# Patient Record
Sex: Female | Born: 1960 | Race: White | Hispanic: No | Marital: Married | Smoking: Former smoker
Health system: Southern US, Community
[De-identification: ages and names within clinical notes are randomized; demographics above are authoritative.]

## PROBLEM LIST (undated history)

## (undated) DIAGNOSIS — K254 Chronic or unspecified gastric ulcer with hemorrhage: Secondary | ICD-10-CM

## (undated) DIAGNOSIS — Z8489 Family history of other specified conditions: Secondary | ICD-10-CM

## (undated) DIAGNOSIS — Z87442 Personal history of urinary calculi: Secondary | ICD-10-CM

## (undated) DIAGNOSIS — M1990 Unspecified osteoarthritis, unspecified site: Secondary | ICD-10-CM

## (undated) DIAGNOSIS — I10 Essential (primary) hypertension: Secondary | ICD-10-CM

## (undated) DIAGNOSIS — Z9889 Other specified postprocedural states: Secondary | ICD-10-CM

## (undated) DIAGNOSIS — R112 Nausea with vomiting, unspecified: Secondary | ICD-10-CM

## (undated) HISTORY — PX: BACK SURGERY: SHX140

## (undated) HISTORY — PX: CHOLECYSTECTOMY: SHX55

## (undated) HISTORY — PX: ABDOMINAL HYSTERECTOMY: SHX81

## (undated) HISTORY — PX: APPENDECTOMY: SHX54

---

## 2001-11-21 ENCOUNTER — Encounter: Payer: Self-pay | Admitting: Family Medicine

## 2001-11-21 ENCOUNTER — Ambulatory Visit (HOSPITAL_COMMUNITY): Admission: RE | Admit: 2001-11-21 | Discharge: 2001-11-21 | Payer: Self-pay | Admitting: Family Medicine

## 2002-06-23 ENCOUNTER — Encounter: Payer: Self-pay | Admitting: Internal Medicine

## 2002-06-23 ENCOUNTER — Ambulatory Visit (HOSPITAL_COMMUNITY): Admission: RE | Admit: 2002-06-23 | Discharge: 2002-06-23 | Payer: Self-pay | Admitting: Internal Medicine

## 2003-01-10 ENCOUNTER — Inpatient Hospital Stay (HOSPITAL_COMMUNITY): Admission: RE | Admit: 2003-01-10 | Discharge: 2003-01-13 | Payer: Self-pay | Admitting: Internal Medicine

## 2003-02-16 ENCOUNTER — Encounter (INDEPENDENT_AMBULATORY_CARE_PROVIDER_SITE_OTHER): Payer: Self-pay | Admitting: Internal Medicine

## 2003-02-16 ENCOUNTER — Ambulatory Visit (HOSPITAL_COMMUNITY): Admission: RE | Admit: 2003-02-16 | Discharge: 2003-02-16 | Payer: Self-pay | Admitting: Internal Medicine

## 2003-03-09 ENCOUNTER — Ambulatory Visit (HOSPITAL_COMMUNITY): Admission: RE | Admit: 2003-03-09 | Discharge: 2003-03-09 | Payer: Self-pay | Admitting: Urology

## 2003-03-09 ENCOUNTER — Encounter: Payer: Self-pay | Admitting: Urology

## 2003-04-04 ENCOUNTER — Ambulatory Visit (HOSPITAL_COMMUNITY): Admission: RE | Admit: 2003-04-04 | Discharge: 2003-04-04 | Payer: Self-pay | Admitting: Internal Medicine

## 2003-07-28 ENCOUNTER — Ambulatory Visit (HOSPITAL_COMMUNITY): Admission: RE | Admit: 2003-07-28 | Discharge: 2003-07-28 | Payer: Self-pay | Admitting: Internal Medicine

## 2003-08-02 ENCOUNTER — Ambulatory Visit (HOSPITAL_COMMUNITY): Admission: RE | Admit: 2003-08-02 | Discharge: 2003-08-02 | Payer: Self-pay | Admitting: Internal Medicine

## 2004-01-01 ENCOUNTER — Emergency Department (HOSPITAL_COMMUNITY): Admission: EM | Admit: 2004-01-01 | Discharge: 2004-01-02 | Payer: Self-pay | Admitting: *Deleted

## 2004-01-03 ENCOUNTER — Ambulatory Visit (HOSPITAL_COMMUNITY): Admission: RE | Admit: 2004-01-03 | Discharge: 2004-01-03 | Payer: Self-pay | Admitting: Urology

## 2004-01-04 ENCOUNTER — Ambulatory Visit (HOSPITAL_COMMUNITY): Admission: RE | Admit: 2004-01-04 | Discharge: 2004-01-04 | Payer: Self-pay | Admitting: Urology

## 2004-01-06 ENCOUNTER — Ambulatory Visit (HOSPITAL_COMMUNITY): Admission: RE | Admit: 2004-01-06 | Discharge: 2004-01-06 | Payer: Self-pay | Admitting: Urology

## 2004-03-29 ENCOUNTER — Ambulatory Visit (HOSPITAL_COMMUNITY): Admission: RE | Admit: 2004-03-29 | Discharge: 2004-03-29 | Payer: Self-pay | Admitting: Urology

## 2004-04-13 ENCOUNTER — Ambulatory Visit (HOSPITAL_COMMUNITY): Admission: RE | Admit: 2004-04-13 | Discharge: 2004-04-13 | Payer: Self-pay | Admitting: Urology

## 2004-09-11 ENCOUNTER — Ambulatory Visit (HOSPITAL_COMMUNITY): Admission: RE | Admit: 2004-09-11 | Discharge: 2004-09-11 | Payer: Self-pay | Admitting: Urology

## 2004-12-24 ENCOUNTER — Ambulatory Visit (HOSPITAL_COMMUNITY): Admission: RE | Admit: 2004-12-24 | Discharge: 2004-12-24 | Payer: Self-pay | Admitting: Family Medicine

## 2005-02-08 ENCOUNTER — Ambulatory Visit (HOSPITAL_COMMUNITY): Admission: RE | Admit: 2005-02-08 | Discharge: 2005-02-08 | Payer: Self-pay | Admitting: Urology

## 2005-03-27 ENCOUNTER — Ambulatory Visit (HOSPITAL_COMMUNITY): Admission: RE | Admit: 2005-03-27 | Discharge: 2005-03-27 | Payer: Self-pay | Admitting: Urology

## 2005-03-28 ENCOUNTER — Ambulatory Visit (HOSPITAL_COMMUNITY): Admission: RE | Admit: 2005-03-28 | Discharge: 2005-03-28 | Payer: Self-pay | Admitting: Internal Medicine

## 2005-08-30 ENCOUNTER — Ambulatory Visit (HOSPITAL_COMMUNITY): Admission: RE | Admit: 2005-08-30 | Discharge: 2005-08-30 | Payer: Self-pay | Admitting: Family Medicine

## 2010-10-13 ENCOUNTER — Encounter: Payer: Self-pay | Admitting: General Surgery

## 2011-04-16 ENCOUNTER — Other Ambulatory Visit (HOSPITAL_COMMUNITY): Payer: Self-pay | Admitting: Internal Medicine

## 2011-04-16 DIAGNOSIS — Z139 Encounter for screening, unspecified: Secondary | ICD-10-CM

## 2011-04-19 ENCOUNTER — Ambulatory Visit (HOSPITAL_COMMUNITY)
Admission: RE | Admit: 2011-04-19 | Discharge: 2011-04-19 | Disposition: A | Discharge status: Home / Self Care | Payer: BC Managed Care – PPO | Source: Ambulatory Visit | Attending: Internal Medicine | Admitting: Internal Medicine

## 2011-04-19 DIAGNOSIS — Z1231 Encounter for screening mammogram for malignant neoplasm of breast: Secondary | ICD-10-CM | POA: Insufficient documentation

## 2011-04-19 DIAGNOSIS — Z139 Encounter for screening, unspecified: Secondary | ICD-10-CM

## 2012-06-15 ENCOUNTER — Other Ambulatory Visit (HOSPITAL_COMMUNITY): Payer: Self-pay | Admitting: Internal Medicine

## 2012-06-15 DIAGNOSIS — Z0000 Encounter for general adult medical examination without abnormal findings: Secondary | ICD-10-CM

## 2012-06-22 ENCOUNTER — Inpatient Hospital Stay (HOSPITAL_COMMUNITY): Admission: RE | Admit: 2012-06-22 | Payer: BC Managed Care – PPO | Source: Ambulatory Visit

## 2015-05-17 ENCOUNTER — Other Ambulatory Visit (HOSPITAL_COMMUNITY): Payer: Self-pay | Admitting: Physician Assistant

## 2015-05-17 DIAGNOSIS — Z139 Encounter for screening, unspecified: Secondary | ICD-10-CM

## 2015-05-24 ENCOUNTER — Ambulatory Visit (HOSPITAL_COMMUNITY): Payer: BC Managed Care – PPO

## 2016-10-21 ENCOUNTER — Encounter (INDEPENDENT_AMBULATORY_CARE_PROVIDER_SITE_OTHER): Payer: Self-pay | Admitting: *Deleted

## 2016-12-26 ENCOUNTER — Other Ambulatory Visit (HOSPITAL_COMMUNITY): Payer: Self-pay | Admitting: Internal Medicine

## 2016-12-26 DIAGNOSIS — Z1231 Encounter for screening mammogram for malignant neoplasm of breast: Secondary | ICD-10-CM

## 2017-01-08 ENCOUNTER — Encounter: Payer: Self-pay | Admitting: Cardiovascular Disease

## 2017-02-11 ENCOUNTER — Ambulatory Visit: Payer: BC Managed Care – PPO | Admitting: Cardiology

## 2017-07-22 NOTE — Patient Instructions (Signed)
Your procedure is scheduled on: 08/04/2017   Report to [facility] at  645   AM.  Call this number if you have problems the morning of surgery: [phone]   Do not eat food or drink liquids :After Midnight.      Take these medicines the morning of surgery with A SIP OF WATER: None   Do not wear jewelry, make-up or nail polish.  Do not wear lotions, powders, or perfumes. You may wear deodorant.  Do not shave 48 hours prior to surgery.  Do not bring valuables to the hospital.  Contacts, dentures or bridgework may not be worn into surgery.  Leave suitcase in the car. After surgery it may be brought to your room.  For patients admitted to the hospital, checkout time is 11:00 AM the day of discharge.   Patients discharged the day of surgery will not be allowed to drive home.  :     Please read over the following fact sheets that you were given: Coughing and Deep Breathing, Surgical Site Infection Prevention, Anesthesia Post-op Instructions and Care and Recovery After Surgery    Cataract A cataract is a clouding of the lens of the eye. When a lens becomes cloudy, vision is reduced based on the degree and nature of the clouding. Many cataracts reduce vision to some degree. Some cataracts make people more near-sighted as they develop. Other cataracts increase glare. Cataracts that are ignored and become worse can sometimes look white. The white color can be seen through the pupil. CAUSES   Aging. However, cataracts may occur at any age, even in newborns.   Certain drugs.   Trauma to the eye.   Certain diseases such as diabetes.   Specific eye diseases such as chronic inflammation inside the eye or a sudden attack of a rare form of glaucoma.   Inherited or acquired medical problems.  SYMPTOMS   Gradual, progressive drop in vision in the affected eye.   Severe, rapid visual loss. This most often happens when trauma is the cause.  DIAGNOSIS  To detect a cataract, an eye doctor examines  the lens. Cataracts are best diagnosed with an exam of the eyes with the pupils enlarged (dilated) by drops.  TREATMENT  For an early cataract, vision may improve by using different eyeglasses or stronger lighting. If that does not help your vision, surgery is the only effective treatment. A cataract needs to be surgically removed when vision loss interferes with your everyday activities, such as driving, reading, or watching TV. A cataract may also have to be removed if it prevents examination or treatment of another eye problem. Surgery removes the cloudy lens and usually replaces it with a substitute lens (intraocular lens, IOL).  At a time when both you and your doctor agree, the cataract will be surgically removed. If you have cataracts in both eyes, only one is usually removed at a time. This allows the operated eye to heal and be out of danger from any possible problems after surgery (such as infection or poor wound healing). In rare cases, a cataract may be doing damage to your eye. In these cases, your caregiver may advise surgical removal right away. The vast majority of people who have cataract surgery have better vision afterward. HOME CARE INSTRUCTIONS  If you are not planning surgery, you may be asked to do the following:  Use different eyeglasses.   Use stronger or brighter lighting.   Ask your eye doctor about reducing your medicine  dose or changing medicines if it is thought that a medicine caused your cataract. Changing medicines does not make the cataract go away on its own.   Become familiar with your surroundings. Poor vision can lead to injury. Avoid bumping into things on the affected side. You are at a higher risk for tripping or falling.   Exercise extreme care when driving or operating machinery.   Wear sunglasses if you are sensitive to bright light or experiencing problems with glare.  SEEK IMMEDIATE MEDICAL CARE IF:   You have a worsening or sudden vision loss.    You notice redness, swelling, or increasing pain in the eye.   You have a fever.  Document Released: 09/09/2005 Document Revised: 08/29/2011 Document Reviewed: 05/03/2011 [facility] Patient Information 2012 [place].PATIENT INSTRUCTIONS POST-ANESTHESIA  IMMEDIATELY FOLLOWING SURGERY:  Do not drive or operate machinery for the first twenty four hours after surgery.  Do not make any important decisions for twenty four hours after surgery or while taking narcotic pain medications or sedatives.  If you develop intractable nausea and vomiting or a severe headache please notify your doctor immediately.  FOLLOW-UP:  Please make an appointment with your surgeon as instructed. You do not need to follow up with anesthesia unless specifically instructed to do so.  WOUND CARE INSTRUCTIONS (if applicable):  Keep a dry clean dressing on the anesthesia/puncture wound site if there is drainage.  Once the wound has quit draining you may leave it open to air.  Generally you should leave the bandage intact for twenty four hours unless there is drainage.  If the epidural site drains for more than 36-48 hours please call the anesthesia department.  QUESTIONS?:  Please feel free to call your physician or the hospital operator if you have any questions, and they will be happy to assist you.

## 2017-07-28 ENCOUNTER — Inpatient Hospital Stay (HOSPITAL_COMMUNITY)
Admission: RE | Admit: 2017-07-28 | Discharge: 2017-07-28 | Disposition: A | Discharge status: Home / Self Care | Payer: BC Managed Care – PPO | Source: Ambulatory Visit

## 2017-08-04 ENCOUNTER — Encounter (HOSPITAL_COMMUNITY): Admission: RE | Payer: Self-pay | Source: Ambulatory Visit

## 2017-08-04 ENCOUNTER — Ambulatory Visit (HOSPITAL_COMMUNITY): Admission: RE | Admit: 2017-08-04 | Payer: BC Managed Care – PPO | Source: Ambulatory Visit | Admitting: Ophthalmology

## 2017-08-04 SURGERY — PHACOEMULSIFICATION, CATARACT, WITH IOL INSERTION
Anesthesia: Monitor Anesthesia Care | Site: Eye | Laterality: Left

## 2018-05-21 ENCOUNTER — Other Ambulatory Visit (HOSPITAL_COMMUNITY): Payer: Self-pay | Admitting: Internal Medicine

## 2018-05-21 DIAGNOSIS — Z1231 Encounter for screening mammogram for malignant neoplasm of breast: Secondary | ICD-10-CM

## 2018-06-05 NOTE — Patient Instructions (Signed)
Your procedure is scheduled on: 06/22/2018  Report to [facility] at   62   AM.  Call this number if you have problems the morning of surgery: [phone]   Do not eat food or drink liquids :After Midnight.      Take these medicines the morning of surgery with A SIP OF WATER: amlodipine, claritin, skelaxin, hydrocodone ( if needed).   Do not wear jewelry, make-up or nail polish.  Do not wear lotions, powders, or perfumes. You may wear deodorant.  Do not shave 48 hours prior to surgery.  Do not bring valuables to the hospital.  Contacts, dentures or bridgework may not be worn into surgery.  Leave suitcase in the car. After surgery it may be brought to your room.  For patients admitted to the hospital, checkout time is 11:00 AM the day of discharge.   Patients discharged the day of surgery will not be allowed to drive home.  :     Please read over the following fact sheets that you were given: Coughing and Deep Breathing, Surgical Site Infection Prevention, Anesthesia Post-op Instructions and Care and Recovery After Surgery    Cataract A cataract is a clouding of the lens of the eye. When a lens becomes cloudy, vision is reduced based on the degree and nature of the clouding. Many cataracts reduce vision to some degree. Some cataracts make people more near-sighted as they develop. Other cataracts increase glare. Cataracts that are ignored and become worse can sometimes look white. The white color can be seen through the pupil. CAUSES   Aging. However, cataracts may occur at any age, even in newborns.   Certain drugs.   Trauma to the eye.   Certain diseases such as diabetes.   Specific eye diseases such as chronic inflammation inside the eye or a sudden attack of a rare form of glaucoma.   Inherited or acquired medical problems.  SYMPTOMS   Gradual, progressive drop in vision in the affected eye.   Severe, rapid visual loss. This most often happens when trauma is the cause.    DIAGNOSIS  To detect a cataract, an eye doctor examines the lens. Cataracts are best diagnosed with an exam of the eyes with the pupils enlarged (dilated) by drops.  TREATMENT  For an early cataract, vision may improve by using different eyeglasses or stronger lighting. If that does not help your vision, surgery is the only effective treatment. A cataract needs to be surgically removed when vision loss interferes with your everyday activities, such as driving, reading, or watching TV. A cataract may also have to be removed if it prevents examination or treatment of another eye problem. Surgery removes the cloudy lens and usually replaces it with a substitute lens (intraocular lens, IOL).  At a time when both you and your doctor agree, the cataract will be surgically removed. If you have cataracts in both eyes, only one is usually removed at a time. This allows the operated eye to heal and be out of danger from any possible problems after surgery (such as infection or poor wound healing). In rare cases, a cataract may be doing damage to your eye. In these cases, your caregiver may advise surgical removal right away. The vast majority of people who have cataract surgery have better vision afterward. HOME CARE INSTRUCTIONS  If you are not planning surgery, you may be asked to do the following:  Use different eyeglasses.   Use stronger or brighter lighting.   Ask  your eye doctor about reducing your medicine dose or changing medicines if it is thought that a medicine caused your cataract. Changing medicines does not make the cataract go away on its own.   Become familiar with your surroundings. Poor vision can lead to injury. Avoid bumping into things on the affected side. You are at a higher risk for tripping or falling.   Exercise extreme care when driving or operating machinery.   Wear sunglasses if you are sensitive to bright light or experiencing problems with glare.  SEEK IMMEDIATE MEDICAL CARE  IF:   You have a worsening or sudden vision loss.   You notice redness, swelling, or increasing pain in the eye.   You have a fever.  Document Released: 09/09/2005 Document Revised: 08/29/2011 Document Reviewed: 05/03/2011 [facility] Patient Information 2012 [place].PATIENT INSTRUCTIONS POST-ANESTHESIA  IMMEDIATELY FOLLOWING SURGERY:  Do not drive or operate machinery for the first twenty four hours after surgery.  Do not make any important decisions for twenty four hours after surgery or while taking narcotic pain medications or sedatives.  If you develop intractable nausea and vomiting or a severe headache please notify your doctor immediately.  FOLLOW-UP:  Please make an appointment with your surgeon as instructed. You do not need to follow up with anesthesia unless specifically instructed to do so.  WOUND CARE INSTRUCTIONS (if applicable):  Keep a dry clean dressing on the anesthesia/puncture wound site if there is drainage.  Once the wound has quit draining you may leave it open to air.  Generally you should leave the bandage intact for twenty four hours unless there is drainage.  If the epidural site drains for more than 36-48 hours please call the anesthesia department.  QUESTIONS?:  Please feel free to call your physician or the hospital operator if you have any questions, and they will be happy to assist you.

## 2018-06-10 ENCOUNTER — Encounter (HOSPITAL_COMMUNITY): Payer: Self-pay | Admitting: Radiology

## 2018-06-10 ENCOUNTER — Ambulatory Visit (HOSPITAL_COMMUNITY)
Admission: RE | Admit: 2018-06-10 | Discharge: 2018-06-10 | Disposition: A | Discharge status: Home / Self Care | Payer: BC Managed Care – PPO | Source: Ambulatory Visit | Attending: Internal Medicine | Admitting: Internal Medicine

## 2018-06-10 ENCOUNTER — Other Ambulatory Visit: Payer: Self-pay

## 2018-06-10 ENCOUNTER — Encounter (HOSPITAL_COMMUNITY)
Admission: RE | Admit: 2018-06-10 | Discharge: 2018-06-10 | Disposition: A | Discharge status: Home / Self Care | Payer: BC Managed Care – PPO | Source: Ambulatory Visit | Attending: Ophthalmology | Admitting: Ophthalmology

## 2018-06-10 DIAGNOSIS — Z1231 Encounter for screening mammogram for malignant neoplasm of breast: Secondary | ICD-10-CM | POA: Diagnosis not present

## 2018-06-10 HISTORY — DX: Personal history of urinary calculi: Z87.442

## 2018-06-10 HISTORY — DX: Essential (primary) hypertension: I10

## 2018-06-10 HISTORY — DX: Unspecified osteoarthritis, unspecified site: M19.90

## 2018-06-10 LAB — CBC WITH DIFFERENTIAL/PLATELET
BASOS ABS: 0.1 10*3/uL (ref 0.0–0.1)
BASOS PCT: 1 %
EOS PCT: 3 %
Eosinophils Absolute: 0.2 10*3/uL (ref 0.0–0.7)
HCT: 38.5 % (ref 36.0–46.0)
Hemoglobin: 12.9 g/dL (ref 12.0–15.0)
Lymphocytes Relative: 41 %
Lymphs Abs: 2.5 10*3/uL (ref 0.7–4.0)
MCH: 31.9 pg (ref 26.0–34.0)
MCHC: 33.5 g/dL (ref 30.0–36.0)
MCV: 95.1 fL (ref 78.0–100.0)
MONO ABS: 0.5 10*3/uL (ref 0.1–1.0)
Monocytes Relative: 8 %
Neutro Abs: 2.8 10*3/uL (ref 1.7–7.7)
Neutrophils Relative %: 47 %
PLATELETS: 351 10*3/uL (ref 150–400)
RBC: 4.05 MIL/uL (ref 3.87–5.11)
RDW: 13.7 % (ref 11.5–15.5)
WBC: 6 10*3/uL (ref 4.0–10.5)

## 2018-06-10 LAB — BASIC METABOLIC PANEL
ANION GAP: 6 (ref 5–15)
BUN: 18 mg/dL (ref 6–20)
CALCIUM: 8.8 mg/dL — AB (ref 8.9–10.3)
CO2: 26 mmol/L (ref 22–32)
Chloride: 107 mmol/L (ref 98–111)
Creatinine, Ser: 0.74 mg/dL (ref 0.44–1.00)
GFR calc Af Amer: >60 mL/min (ref >60)
Glucose, Bld: 95 mg/dL (ref 70–99)
POTASSIUM: 3.7 mmol/L (ref 3.5–5.1)
SODIUM: 139 mmol/L (ref 135–145)

## 2018-06-19 MED ORDER — CYCLOPENTOLATE-PHENYLEPHRINE 0.2-1 % OP SOLN
OPHTHALMIC | Status: AC
  Filled 2018-06-19: qty 2

## 2018-06-19 MED ORDER — PHENYLEPHRINE HCL 2.5 % OP SOLN
OPHTHALMIC | Status: AC
  Filled 2018-06-19: qty 15

## 2018-06-19 MED ORDER — LIDOCAINE HCL (PF) 1 % IJ SOLN
INTRAMUSCULAR | Status: AC
  Filled 2018-06-19: qty 2

## 2018-06-19 MED ORDER — LIDOCAINE HCL 3.5 % OP GEL
OPHTHALMIC | Status: AC
  Filled 2018-06-19: qty 1

## 2018-06-19 MED ORDER — TETRACAINE HCL 0.5 % OP SOLN
OPHTHALMIC | Status: AC
  Filled 2018-06-19: qty 4

## 2018-06-19 MED ORDER — NEOMYCIN-POLYMYXIN-DEXAMETH 3.5-10000-0.1 OP SUSP
OPHTHALMIC | Status: AC
  Filled 2018-06-19: qty 5

## 2018-06-22 ENCOUNTER — Ambulatory Visit (HOSPITAL_COMMUNITY)
Admission: RE | Admit: 2018-06-22 | Discharge: 2018-06-22 | Disposition: A | Discharge status: Home / Self Care | Payer: BC Managed Care – PPO | Source: Ambulatory Visit | Attending: Ophthalmology | Admitting: Ophthalmology

## 2018-06-22 ENCOUNTER — Encounter (HOSPITAL_COMMUNITY): Payer: Self-pay | Admitting: *Deleted

## 2018-06-22 ENCOUNTER — Ambulatory Visit (HOSPITAL_COMMUNITY): Payer: BC Managed Care – PPO | Admitting: Anesthesiology

## 2018-06-22 ENCOUNTER — Encounter (HOSPITAL_COMMUNITY)
Admission: RE | Disposition: A | Discharge status: Home / Self Care | Payer: Self-pay | Source: Ambulatory Visit | Attending: Ophthalmology

## 2018-06-22 DIAGNOSIS — Z87891 Personal history of nicotine dependence: Secondary | ICD-10-CM | POA: Insufficient documentation

## 2018-06-22 DIAGNOSIS — H269 Unspecified cataract: Secondary | ICD-10-CM | POA: Diagnosis present

## 2018-06-22 DIAGNOSIS — H25812 Combined forms of age-related cataract, left eye: Secondary | ICD-10-CM | POA: Insufficient documentation

## 2018-06-22 DIAGNOSIS — Z79899 Other long term (current) drug therapy: Secondary | ICD-10-CM | POA: Diagnosis not present

## 2018-06-22 DIAGNOSIS — I10 Essential (primary) hypertension: Secondary | ICD-10-CM | POA: Insufficient documentation

## 2018-06-22 DIAGNOSIS — Z880 Allergy status to penicillin: Secondary | ICD-10-CM | POA: Insufficient documentation

## 2018-06-22 HISTORY — PX: CATARACT EXTRACTION W/PHACO: SHX586

## 2018-06-22 SURGERY — PHACOEMULSIFICATION, CATARACT, WITH IOL INSERTION
Anesthesia: Monitor Anesthesia Care | Site: Eye | Laterality: Left

## 2018-06-22 MED ORDER — LIDOCAINE HCL (PF) 1 % IJ SOLN
INTRAOCULAR | Status: DC | PRN
  Administered 2018-06-22: .7 mL via OPHTHALMIC

## 2018-06-22 MED ORDER — BSS IO SOLN
INTRAOCULAR | Status: DC | PRN
  Administered 2018-06-22: 15 mL

## 2018-06-22 MED ORDER — NA HYALUR & NA CHOND-NA HYALUR 0.55-0.5 ML IO KIT
PACK | INTRAOCULAR | Status: DC | PRN
  Administered 2018-06-22: 1 via OPHTHALMIC

## 2018-06-22 MED ORDER — LACTATED RINGERS IV SOLN
INTRAVENOUS | Status: DC
  Administered 2018-06-22: 07:00:00 via INTRAVENOUS

## 2018-06-22 MED ORDER — CYCLOPENTOLATE-PHENYLEPHRINE 0.2-1 % OP SOLN
1.0000 [drp] | OPHTHALMIC | Status: AC
  Administered 2018-06-22 (×3): 1 [drp] via OPHTHALMIC

## 2018-06-22 MED ORDER — TETRACAINE HCL 0.5 % OP SOLN
1.0000 [drp] | OPHTHALMIC | Status: AC
  Administered 2018-06-22 (×3): 1 [drp] via OPHTHALMIC

## 2018-06-22 MED ORDER — NEOMYCIN-POLYMYXIN-DEXAMETH 3.5-10000-0.1 OP SUSP
OPHTHALMIC | Status: DC | PRN
  Administered 2018-06-22: 2 [drp] via OPHTHALMIC

## 2018-06-22 MED ORDER — MIDAZOLAM HCL 5 MG/5ML IJ SOLN
INTRAMUSCULAR | Status: DC | PRN
  Administered 2018-06-22: 2 mg via INTRAVENOUS

## 2018-06-22 MED ORDER — LIDOCAINE HCL 3.5 % OP GEL
1.0000 "application " | Freq: Once | OPHTHALMIC | Status: AC
  Administered 2018-06-22: 1 via OPHTHALMIC

## 2018-06-22 MED ORDER — POVIDONE-IODINE 5 % OP SOLN
OPHTHALMIC | Status: DC | PRN
  Administered 2018-06-22: 1 via OPHTHALMIC

## 2018-06-22 MED ORDER — MIDAZOLAM HCL 2 MG/2ML IJ SOLN
INTRAMUSCULAR | Status: AC
  Filled 2018-06-22: qty 2

## 2018-06-22 MED ORDER — EPINEPHRINE PF 1 MG/ML IJ SOLN
INTRAOCULAR | Status: DC | PRN
  Administered 2018-06-22: 500 mL

## 2018-06-22 MED ORDER — PHENYLEPHRINE HCL 2.5 % OP SOLN
1.0000 [drp] | OPHTHALMIC | Status: AC
  Administered 2018-06-22 (×3): 1 [drp] via OPHTHALMIC

## 2018-06-22 SURGICAL SUPPLY — 12 items
CLOTH BEACON ORANGE TIMEOUT ST (SAFETY) ×1 IMPLANT
EYE SHIELD UNIVERSAL CLEAR (GAUZE/BANDAGES/DRESSINGS) ×1 IMPLANT
GLOVE BIOGEL PI IND STRL 6.5 (GLOVE) IMPLANT
GLOVE BIOGEL PI IND STRL 7.0 (GLOVE) IMPLANT
GLOVE BIOGEL PI INDICATOR 6.5 (GLOVE) ×1
GLOVE BIOGEL PI INDICATOR 7.0 (GLOVE) ×1
LENS ALC ACRYL/TECN (Ophthalmic Related) ×1 IMPLANT
PAD ARMBOARD 7.5X6 YLW CONV (MISCELLANEOUS) ×1 IMPLANT
SYRINGE LUER LOK 1CC (MISCELLANEOUS) ×1 IMPLANT
TAPE SURG TRANSPORE 1 IN (GAUZE/BANDAGES/DRESSINGS) IMPLANT
TAPE SURGICAL TRANSPORE 1 IN (GAUZE/BANDAGES/DRESSINGS) ×1
WATER STERILE IRR 250ML POUR (IV SOLUTION) ×1 IMPLANT

## 2018-06-22 NOTE — Anesthesia Postprocedure Evaluation (Signed)
Anesthesia Post Note  Patient: Kristi Roman  Procedure(s) Performed: CATARACT EXTRACTION PHACO AND INTRAOCULAR LENS PLACEMENT ([place]) (Left Eye)  Patient location during evaluation: Short Stay Anesthesia Type: MAC Level of consciousness: awake and alert Pain management: pain level controlled Vital Signs Assessment: post-procedure vital signs reviewed and stable Respiratory status: spontaneous breathing Cardiovascular status: stable Postop Assessment: no apparent nausea or vomiting Anesthetic complications: no     Last Vitals:  Vitals:   06/22/18 0717 06/22/18 0738  BP:  (!) 147/91  Pulse:  92  Resp: (!) 36 16  Temp:  37.1 C  SpO2: 98% 98%    Last Pain:  Vitals:   06/22/18 0738  TempSrc: Oral  PainSc: 0-No pain                 [name]

## 2018-06-22 NOTE — Discharge Instructions (Signed)

## 2018-06-22 NOTE — Addendum Note (Signed)
Addendum  created 06/22/18 1041 by Vista Deck, CRNA   Sign clinical note, SmartForm saved

## 2018-06-22 NOTE — Op Note (Signed)
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  Assistants: None  Anesthesia: Topical with MAC  Indications: Painless, progressive loss of vision with compromise of daily activities.  Surgery: Cataract Extraction with Intraocular lens Implant Left Eye  Discription: The patient had dilating drops and viscous lidocaine placed into the Left eye in the pre-op holding area. After transfer to the operating room, a time out was performed. The patient was then prepped and draped. Beginning with a 32m blade a paracentesis port was made at the surgeon's 2 o'clock position. The anterior chamber was then filled with 1% non-preserved lidocaine with epinepherine. This was followed by filling the anterior chamber with Viscoat.  A 2.417mkeratome blade was used to make a clear corneal incision at the temporal limbus.  A bent cystatome needle was used to create a continuous tear capsulotomy. Hydrodissection was performed with balanced salt solution on a Fine canula. The lens nucleus was then removed using the phacoemulsification handpiece. Residual cortex was removed with the I&A handpiece. The anterior chamber and capsular bag were refilled with Provisc. A posterior chamber intraocular lens was placed into the capsular bag with it's injector. The implant was positioned with the Kuglan hook. The Provisc was then removed from the anterior chamber and capsular bag with the I&A handpiece. Stromal hydration of the main incision and paracentesis port was performed with BSS on a Fine canula. The wounds were tested for leak which was negative. The patient tolerated the procedure well. There were no operative complications. The patient was then transferred to the recovery room in stable condition.  Complications: None  Specimen: None  EBL: None  Prosthetic device: J&J Technis, PCB00,  power 26.0, SN [id number]

## 2018-06-22 NOTE — Addendum Note (Signed)
Addendum  created 06/22/18 0818 by Vista Deck, CRNA   Intraprocedure Flowsheets edited

## 2018-06-22 NOTE — H&P (Signed)
I have reviewed the H&P, the patient was re-examined, and I have identified no interval changes in medical condition and plan of care since the history and physical of record  

## 2018-06-22 NOTE — Anesthesia Preprocedure Evaluation (Addendum)
Anesthesia Evaluation  Patient identified by MRN, date of birth, ID band Patient awake    Reviewed: Allergy & Precautions, H&P , NPO status , Patient's Chart, lab work & pertinent test results, reviewed documented beta blocker date and time   Airway Mallampati: I  TM Distance: >3 FB Neck ROM: full    Dental no notable dental hx. (+) Teeth Intact, Dental Advidsory Given   Pulmonary neg pulmonary ROS, former smoker,    Pulmonary exam normal breath sounds clear to auscultation       Cardiovascular Exercise Tolerance: Good hypertension, On Medications negative cardio ROS   Rhythm:regular Rate:Normal     Neuro/Psych negative neurological ROS  negative psych ROS   GI/Hepatic negative GI ROS, Neg liver ROS,   Endo/Other  negative endocrine ROS  Renal/GU negative Renal ROS  negative genitourinary   Musculoskeletal   Abdominal   Peds  Hematology negative hematology ROS (+)   Anesthesia Other Findings NSR 92 Denies any sig PMH Former smoker  Reproductive/Obstetrics negative OB ROS                             Anesthesia Physical Anesthesia Plan  ASA: II  Anesthesia Plan: MAC   Post-op Pain Management:    Induction:   PONV Risk Score and Plan:   Airway Management Planned:   Additional Equipment:   Intra-op Plan:   Post-operative Plan:   Informed Consent: I have reviewed the patients History and Physical, chart, labs and discussed the procedure including the risks, benefits and alternatives for the proposed anesthesia with the patient or authorized representative who has indicated his/her understanding and acceptance.   Dental Advisory Given  Plan Discussed with: CRNA and Anesthesiologist  Anesthesia Plan Comments:        Anesthesia Quick Evaluation

## 2018-06-22 NOTE — Transfer of Care (Signed)
Immediate Anesthesia Transfer of Care Note  Patient: Kristi Roman  Procedure(s) Performed: CATARACT EXTRACTION PHACO AND INTRAOCULAR LENS PLACEMENT ([facility]) (Left Eye)  Patient Location: Short Stay  Anesthesia Type:MAC  Level of Consciousness: awake, alert  and patient cooperative  Airway & Oxygen Therapy: Patient Spontanous Breathing  Post-op Assessment: Report given to RN and Post -op Vital signs reviewed and stable  Post vital signs: Reviewed and stable  Last Vitals:  Vitals Value Taken Time  BP    Temp    Pulse    Resp    SpO2      Last Pain:  Vitals:   06/22/18 0637  TempSrc: Oral  PainSc: 0-No pain      Patients Stated Pain Goal: 6 (67/20/91 9802)  Complications: No apparent anesthesia complications

## 2018-06-23 ENCOUNTER — Encounter (HOSPITAL_COMMUNITY): Payer: Self-pay | Admitting: Ophthalmology

## 2018-07-16 ENCOUNTER — Encounter (HOSPITAL_COMMUNITY): Payer: Self-pay

## 2018-07-16 ENCOUNTER — Encounter (HOSPITAL_COMMUNITY)
Admission: RE | Admit: 2018-07-16 | Discharge: 2018-07-16 | Disposition: A | Discharge status: Home / Self Care | Payer: BC Managed Care – PPO | Source: Ambulatory Visit | Attending: Ophthalmology | Admitting: Ophthalmology

## 2018-07-20 ENCOUNTER — Ambulatory Visit (HOSPITAL_COMMUNITY): Payer: BC Managed Care – PPO | Admitting: Anesthesiology

## 2018-07-20 ENCOUNTER — Encounter (HOSPITAL_COMMUNITY): Payer: Self-pay | Admitting: *Deleted

## 2018-07-20 ENCOUNTER — Ambulatory Visit (HOSPITAL_COMMUNITY)
Admission: RE | Admit: 2018-07-20 | Discharge: 2018-07-20 | Disposition: A | Discharge status: Home / Self Care | Payer: BC Managed Care – PPO | Source: Ambulatory Visit | Attending: Ophthalmology | Admitting: Ophthalmology

## 2018-07-20 ENCOUNTER — Encounter (HOSPITAL_COMMUNITY)
Admission: RE | Disposition: A | Discharge status: Home / Self Care | Payer: Self-pay | Source: Ambulatory Visit | Attending: Ophthalmology

## 2018-07-20 DIAGNOSIS — Z880 Allergy status to penicillin: Secondary | ICD-10-CM | POA: Diagnosis not present

## 2018-07-20 DIAGNOSIS — I10 Essential (primary) hypertension: Secondary | ICD-10-CM | POA: Diagnosis not present

## 2018-07-20 DIAGNOSIS — K219 Gastro-esophageal reflux disease without esophagitis: Secondary | ICD-10-CM | POA: Insufficient documentation

## 2018-07-20 DIAGNOSIS — H25811 Combined forms of age-related cataract, right eye: Secondary | ICD-10-CM | POA: Insufficient documentation

## 2018-07-20 DIAGNOSIS — Z87891 Personal history of nicotine dependence: Secondary | ICD-10-CM | POA: Insufficient documentation

## 2018-07-20 DIAGNOSIS — M1990 Unspecified osteoarthritis, unspecified site: Secondary | ICD-10-CM | POA: Diagnosis not present

## 2018-07-20 HISTORY — PX: CATARACT EXTRACTION W/PHACO: SHX586

## 2018-07-20 SURGERY — PHACOEMULSIFICATION, CATARACT, WITH IOL INSERTION
Anesthesia: Monitor Anesthesia Care | Site: Eye | Laterality: Right

## 2018-07-20 MED ORDER — TETRACAINE HCL 0.5 % OP SOLN
1.0000 [drp] | OPHTHALMIC | Status: AC
  Administered 2018-07-20 (×3): 1 [drp] via OPHTHALMIC

## 2018-07-20 MED ORDER — PHENYLEPHRINE HCL 2.5 % OP SOLN
1.0000 [drp] | OPHTHALMIC | Status: AC
  Administered 2018-07-20 (×3): 1 [drp] via OPHTHALMIC

## 2018-07-20 MED ORDER — EPINEPHRINE PF 1 MG/ML IJ SOLN
INTRAOCULAR | Status: DC | PRN
  Administered 2018-07-20: 500 mL

## 2018-07-20 MED ORDER — MIDAZOLAM HCL 2 MG/2ML IJ SOLN
INTRAMUSCULAR | Status: AC
  Filled 2018-07-20: qty 2

## 2018-07-20 MED ORDER — CYCLOPENTOLATE-PHENYLEPHRINE 0.2-1 % OP SOLN
1.0000 [drp] | OPHTHALMIC | Status: AC
  Administered 2018-07-20 (×3): 1 [drp] via OPHTHALMIC

## 2018-07-20 MED ORDER — MIDAZOLAM HCL 5 MG/5ML IJ SOLN
INTRAMUSCULAR | Status: DC | PRN
  Administered 2018-07-20: 2 mg via INTRAVENOUS

## 2018-07-20 MED ORDER — LIDOCAINE HCL 3.5 % OP GEL
1.0000 "application " | Freq: Once | OPHTHALMIC | Status: AC
  Administered 2018-07-20: 1 via OPHTHALMIC

## 2018-07-20 MED ORDER — NA HYALUR & NA CHOND-NA HYALUR 0.55-0.5 ML IO KIT
PACK | INTRAOCULAR | Status: DC | PRN
  Administered 2018-07-20: 1 via OPHTHALMIC

## 2018-07-20 MED ORDER — LIDOCAINE HCL (PF) 1 % IJ SOLN
INTRAMUSCULAR | Status: DC | PRN
  Administered 2018-07-20: .9 mL

## 2018-07-20 MED ORDER — LIDOCAINE 3.5 % OP GEL OPTIME - NO CHARGE
OPHTHALMIC | Status: DC | PRN
  Administered 2018-07-20: 1 [drp] via OPHTHALMIC

## 2018-07-20 MED ORDER — POVIDONE-IODINE 5 % OP SOLN
OPHTHALMIC | Status: DC | PRN
  Administered 2018-07-20: 1 via OPHTHALMIC

## 2018-07-20 MED ORDER — NEOMYCIN-POLYMYXIN-DEXAMETH 3.5-10000-0.1 OP SUSP
OPHTHALMIC | Status: DC | PRN
  Administered 2018-07-20: 1 [drp] via OPHTHALMIC

## 2018-07-20 MED ORDER — BSS IO SOLN
INTRAOCULAR | Status: DC | PRN
  Administered 2018-07-20: 15 mL

## 2018-07-20 MED ORDER — EPINEPHRINE PF 1 MG/ML IJ SOLN
INTRAMUSCULAR | Status: AC
  Filled 2018-07-20: qty 1

## 2018-07-20 MED ORDER — LACTATED RINGERS IV SOLN
INTRAVENOUS | Status: DC
  Administered 2018-07-20: 1000 mL via INTRAVENOUS

## 2018-07-20 SURGICAL SUPPLY — 12 items

## 2018-07-20 NOTE — H&P (Signed)
I have reviewed the H&P, the patient was re-examined, and I have identified no interval changes in medical condition and plan of care since the history and physical of record  

## 2018-07-20 NOTE — Anesthesia Preprocedure Evaluation (Signed)
Anesthesia Evaluation  Patient identified by MRN, date of birth, ID band Patient awake    Reviewed: Allergy & Precautions, NPO status , Patient's Chart, lab work & pertinent test results  Airway Mallampati: I  TM Distance: >3 FB Neck ROM: Full    Dental no notable dental hx. (+) Teeth Intact   Pulmonary neg pulmonary ROS, former smoker,    Pulmonary exam normal breath sounds clear to auscultation       Cardiovascular Exercise Tolerance: Good hypertension, Pt. on medications negative cardio ROS Normal cardiovascular examI Rhythm:Regular Rate:Normal     Neuro/Psych negative neurological ROS  negative psych ROS   GI/Hepatic Neg liver ROS, GERD  ,occ gerd - denies symptoms today or any current meds    Endo/Other  negative endocrine ROS  Renal/GU negative Renal ROS  negative genitourinary   Musculoskeletal  (+) Arthritis , Osteoarthritis,    Abdominal   Peds negative pediatric ROS (+)  Hematology negative hematology ROS (+)   Anesthesia Other Findings   Reproductive/Obstetrics negative OB ROS                             Anesthesia Physical Anesthesia Plan  ASA: II  Anesthesia Plan: MAC   Post-op Pain Management:    Induction: Intravenous  PONV Risk Score and Plan:   Airway Management Planned: Nasal Cannula and Simple Face Mask  Additional Equipment:   Intra-op Plan:   Post-operative Plan:   Informed Consent: I have reviewed the patients History and Physical, chart, labs and discussed the procedure including the risks, benefits and alternatives for the proposed anesthesia with the patient or authorized representative who has indicated his/her understanding and acceptance.   Dental advisory given  Plan Discussed with: CRNA  Anesthesia Plan Comments:         Anesthesia Quick Evaluation

## 2018-07-20 NOTE — Op Note (Signed)
Date of Admission: 07/20/2018  Date of Surgery: 07/20/2018  Pre-Op Dx: Cataract Right  Eye  Post-Op Dx: Senile Combined Cataract  Right  Eye,  Dx Code A57.903  Surgeon: Tonny Branch, M.D.  Assistants: None  Anesthesia: Topical with MAC  Indications: Painless, progressive loss of vision with compromise of daily activities.  Surgery: Cataract Extraction with Intraocular lens Implant Right Eye  Discription: The patient had dilating drops and viscous lidocaine placed into the Right eye in the pre-op holding area. After transfer to the operating room, a time out was performed. The patient was then prepped and draped. Beginning with a 83m blade a paracentesis port was made at the surgeon's 2 o'clock position. The anterior chamber was then filled with 1% non-preserved lidocaine. This was followed by filling the anterior chamber with Provisc.  A 2.451mkeratome blade was used to make a clear corneal incision at the temporal limbus.  A bent cystatome needle was used to create a continuous tear capsulotomy. Hydrodissection was performed with balanced salt solution on a Fine canula. The lens nucleus was then removed using the phacoemulsification handpiece. Residual cortex was removed with the I&A handpiece. The anterior chamber and capsular bag were refilled with Provisc. A posterior chamber intraocular lens was placed into the capsular bag with it's injector. The implant was positioned with the Kuglan hook. The Provisc was then removed from the anterior chamber and capsular bag with the I&A handpiece. Stromal hydration of the main incision and paracentesis port was performed with BSS on a Fine canula. The wounds were tested for leak which was negative. The patient tolerated the procedure well. There were no operative complications. The patient was then transferred to the recovery room in stable condition.  Complications: None  Specimen: None  EBL: None  Prosthetic device: J&J Technis, PCB00, power 25.0,  SN [id number]

## 2018-07-20 NOTE — Anesthesia Postprocedure Evaluation (Signed)
Anesthesia Post Note  Patient: Hayden Pedro  Procedure(s) Performed: CATARACT EXTRACTION PHACO AND INTRAOCULAR LENS PLACEMENT RIGHT EYE CDE=4.50 (Right Eye)  Patient location during evaluation: Short Stay Anesthesia Type: MAC Level of consciousness: awake and alert and oriented Pain management: pain level controlled Vital Signs Assessment: post-procedure vital signs reviewed and stable Respiratory status: spontaneous breathing Cardiovascular status: blood pressure returned to baseline Postop Assessment: no apparent nausea or vomiting Anesthetic complications: no     Last Vitals:  Vitals:   07/20/18 0819  BP: (!) 153/87  Resp: 18  Temp: 37.1 C  SpO2: 95%    Last Pain:  Vitals:   07/20/18 0819  TempSrc: Oral  PainSc: 0-No pain                 Abrahan Fulmore

## 2018-07-20 NOTE — Transfer of Care (Signed)
Immediate Anesthesia Transfer of Care Note  Patient: Kristi Roman  Procedure(s) Performed: CATARACT EXTRACTION PHACO AND INTRAOCULAR LENS PLACEMENT RIGHT EYE CDE=4.50 (Right Eye)  Patient Location: Short Stay  Anesthesia Type:MAC  Level of Consciousness: awake, alert  and oriented  Airway & Oxygen Therapy: Patient Spontanous Breathing  Post-op Assessment: Report given to RN  Post vital signs: Reviewed  Last Vitals:  Vitals Value Taken Time  BP    Temp    Pulse    Resp    SpO2      Last Pain:  Vitals:   07/20/18 0819  TempSrc: Oral  PainSc: 0-No pain      Patients Stated Pain Goal: 8 (40/81/44 8185)  Complications: No apparent anesthesia complications

## 2018-07-20 NOTE — Discharge Instructions (Signed)

## 2018-07-21 ENCOUNTER — Encounter (HOSPITAL_COMMUNITY): Payer: Self-pay | Admitting: Ophthalmology

## 2019-08-26 ENCOUNTER — Other Ambulatory Visit: Payer: Self-pay

## 2019-08-26 DIAGNOSIS — Z20822 Contact with and (suspected) exposure to covid-19: Secondary | ICD-10-CM

## 2019-08-30 ENCOUNTER — Telehealth: Payer: Self-pay

## 2019-08-30 LAB — NOVEL CORONAVIRUS, NAA: SARS-CoV-2, NAA: NOT DETECTED

## 2019-08-30 NOTE — Telephone Encounter (Signed)

## 2020-02-14 ENCOUNTER — Telehealth: Payer: Self-pay | Admitting: Urology

## 2020-04-12 ENCOUNTER — Encounter: Payer: Self-pay | Admitting: Urology

## 2020-04-12 ENCOUNTER — Other Ambulatory Visit: Payer: Self-pay

## 2020-04-12 ENCOUNTER — Ambulatory Visit (INDEPENDENT_AMBULATORY_CARE_PROVIDER_SITE_OTHER): Payer: BC Managed Care – PPO | Admitting: Urology

## 2020-04-12 VITALS — BP 152/77 | HR 101 | Temp 98.1°F | Ht 66.0 in | Wt 160.0 lb

## 2020-04-12 DIAGNOSIS — N200 Calculus of kidney: Secondary | ICD-10-CM

## 2020-04-12 DIAGNOSIS — N390 Urinary tract infection, site not specified: Secondary | ICD-10-CM | POA: Diagnosis not present

## 2020-04-12 LAB — POCT URINALYSIS DIPSTICK
Bilirubin, UA: NEGATIVE
Glucose, UA: NEGATIVE
Ketones, UA: NEGATIVE
Leukocytes, UA: NEGATIVE
Nitrite, UA: NEGATIVE
Protein, UA: POSITIVE — AB
Spec Grav, UA: 1.02 (ref 1.010–1.025)
Urobilinogen, UA: 0.2 E.U./dL
pH, UA: 6 (ref 5.0–8.0)

## 2020-04-12 LAB — BLADDER SCAN AMB NON-IMAGING: Scan Result: 41.9

## 2020-04-12 NOTE — Progress Notes (Signed)
Urological Symptom Review  Patient is experiencing the following symptoms: Hard to postpone urination Get up at night to urinate Stream starts and stops Weak stream Kidney stones  Review of Systems  Gastrointestinal (upper)  : Indigestion/heartburn  Gastrointestinal (lower) : Negative for lower GI symptoms  Constitutional : Night sweats  Skin: Negative for skin symptoms  Eyes: Negative for eye symptoms  Ear/Nose/Throat : Sinus problems  Hematologic/Lymphatic: Negative for Hematologic/Lymphatic symptoms  Cardiovascular : Negative for cardiovascular symptoms  Respiratory : Negative for respiratory symptoms  Endocrine: Negative for endocrine symptoms  Musculoskeletal: Back pain Joint pain  Neurological: Headaches  Psychologic: Negative for psychiatric symptoms

## 2020-04-12 NOTE — Progress Notes (Signed)
04/12/2020 2:29 PM   Chaye Carlye Grippe [birth year] [id number]  Referring provider: Redmond School, MD [address] [place],  [place] [address]  Frequent UTI  HPI: Ms Waymire is a 59yo here for evaluation of frequent UTI and nephrolithiasis. She has had 3 UTIs in the past year. She passed a calculus 6 weeks ago. First stone event was 20 years ago. She was having left flank pain at the time. She had a stone events 15 years ago which required ESWL with Dr. Michela Pitcher. NO recent Upper tract imaging. Remote smoking rx.    PMH: Past Medical History:  Diagnosis Date   Arthritis    History of kidney stones    Hypertension     Surgical History: Past Surgical History:  Procedure Laterality Date   ABDOMINAL HYSTERECTOMY     APPENDECTOMY     BACK SURGERY     removal of cyst   CATARACT EXTRACTION W/PHACO Left 06/22/2018   Procedure: CATARACT EXTRACTION PHACO AND INTRAOCULAR LENS PLACEMENT (IOC);  Surgeon: Tonny Branch, MD;  Location: [facility];  Service: Ophthalmology;  Laterality: Left;  CDE: 6.03   CATARACT EXTRACTION W/PHACO Right 07/20/2018   Procedure: CATARACT EXTRACTION PHACO AND INTRAOCULAR LENS PLACEMENT RIGHT EYE CDE=4.50;  Surgeon: Tonny Branch, MD;  Location: [facility];  Service: Ophthalmology;  Laterality: Right;  right   CHOLECYSTECTOMY      Home Medications:  Allergies as of 04/12/2020      Reactions   Penicillins Anaphylaxis   Has patient had a PCN reaction causing immediate rash, facial/tongue/throat swelling, SOB or lightheadedness with hypotension: yes Has patient had a PCN reaction causing severe rash involving mucus membranes or skin necrosis: no Has patient had a PCN reaction that required hospitalization: no Has patient had a PCN reaction occurring within the last 10 years: no If all of the above answers are "NO", then may proceed with Cephalosporin use.      Medication List       Accurate as of April 12, 2020  2:29 PM. If you have any questions,  ask your nurse or doctor.        amLODipine 10 MG tablet Commonly known as: NORVASC Take 5 mg by mouth at bedtime.   ciprofloxacin 500 MG tablet Commonly known as: CIPRO Take 500 mg by mouth 2 (two) times daily.   diphenhydrAMINE 25 mg capsule Commonly known as: BENADRYL Take 25 mg by mouth at bedtime as needed for allergies.   fexofenadine-pseudoephedrine 60-120 MG 12 hr tablet Commonly known as: ALLEGRA-D Take 1 tablet by mouth daily. May repeat dose in the evening if needed for severe allergies.   fluticasone 50 MCG/ACT nasal spray Commonly known as: FLONASE Place 1 spray into both nostrils daily.   HYDROcodone-acetaminophen 10-325 MG tablet Commonly known as: NORCO Take 1 tablet by mouth every 6 (six) hours as needed for moderate pain.   Melatonin 10 MG Subl Take 10 mg by mouth at bedtime.   metaxalone 800 MG tablet Commonly known as: SKELAXIN Take 800 mg by mouth 4 (four) times daily as needed for muscle spasms.   OVER THE COUNTER MEDICATION Take 1 capsule by mouth 2 (two) times daily with a meal. Natrol Cognium (100 mg Cera-Q) Take 1 capsule with breakfast & 1 capsule in the evening with supper   sodium chloride 0.65 % Soln nasal spray Commonly known as: OCEAN Place 1 spray into both nostrils 4 (four) times daily as needed for congestion.   valACYclovir 1000 MG tablet Commonly known as:  VALTREX Take 1,000 mg by mouth daily as needed (fever blisters).       Allergies:  Allergies  Allergen Reactions   Penicillins Anaphylaxis    Has patient had a PCN reaction causing immediate rash, facial/tongue/throat swelling, SOB or lightheadedness with hypotension: yes Has patient had a PCN reaction causing severe rash involving mucus membranes or skin necrosis: no Has patient had a PCN reaction that required hospitalization: no Has patient had a PCN reaction occurring within the last 10 years: no If all of the above answers are "NO", then may proceed with  Cephalosporin use.     Family History: History reviewed. No pertinent family history.  Social History:  reports that she quit smoking about 9 years ago. Her smoking use included cigarettes. She has a 20.00 pack-year smoking history. She has never used smokeless tobacco. She reports previous alcohol use. She reports that she does not use drugs.  ROS: All other review of systems were reviewed and are negative except what is noted above in HPI  Physical Exam: BP (!) 152/77    Pulse (!) 101    Temp 98.1 F (36.7 C)    Ht 5\' 6"  (1.676 m)    Wt 160 lb (72.6 kg)    BMI 25.82 kg/m   Constitutional:  Alert and oriented, No acute distress. HEENT: [place] AT, moist mucus membranes.  Trachea midline, no masses. Cardiovascular: No clubbing, cyanosis, or edema. Respiratory: Normal respiratory effort, no increased work of breathing. GI: Abdomen is soft, nontender, nondistended, no abdominal masses GU: No CVA tenderness.  Lymph: No cervical or inguinal lymphadenopathy. Skin: No rashes, bruises or suspicious lesions. Neurologic: Grossly intact, no focal deficits, moving all 4 extremities. Psychiatric: Normal mood and affect.  Laboratory Data: Lab Results  Component Value Date   WBC 6.0 06/10/2018   HGB 12.9 06/10/2018   HCT 38.5 06/10/2018   MCV 95.1 06/10/2018   PLT 351 06/10/2018    Lab Results  Component Value Date   CREATININE 0.74 06/10/2018    No results found for: PSA  No results found for: TESTOSTERONE  No results found for: HGBA1C  Urinalysis    Component Value Date/Time   BILIRUBINUR neg 04/12/2020 1420   PROTEINUR Positive (A) 04/12/2020 1420   UROBILINOGEN 0.2 04/12/2020 1420   NITRITE neg 04/12/2020 1420   LEUKOCYTESUR Negative 04/12/2020 1420    No results found for: LABMICR, [place], RBCUA, LABEPIT, MUCUS, BACTERIA  Pertinent Imaging:  Results for orders placed during the hospital encounter of 03/28/05  DG Abd 1 View  Narrative History: Right renal calculi,  post lithotripsy  ABDOMEN ONE VIEW:  No ureteral calcification seen. Tiny calculus mid right kidney unchanged. Calculus at lower pole right kidney on preceding exam no longer identified. Bowel gas pattern normal. Bones unremarkable.  IMPRESSION: Single tiny residual right renal calculus. 4 mm diameter right renal calculus on previous exam no longer identified.  Provider: Barnabas Lister  No results found for this or any previous visit.  No results found for this or any previous visit.  No results found for this or any previous visit.  No results found for this or any previous visit.  No results found for this or any previous visit.  No results found for this or any previous visit.  No results found for this or any previous visit.   Assessment & Plan:    1. Frequent UTI -we will defer treatment pending CT stone study - POCT urinalysis dipstick - BLADDER SCAN AMB NON-IMAGING  2. Nephrolithiasis -CT stone study   No follow-ups on file.  Nicolette Bang, MD  [facility] [place]

## 2020-04-12 NOTE — Patient Instructions (Signed)

## 2020-04-12 NOTE — Telephone Encounter (Signed)
error 

## 2020-04-19 ENCOUNTER — Ambulatory Visit (HOSPITAL_COMMUNITY)
Admission: RE | Admit: 2020-04-19 | Discharge: 2020-04-19 | Disposition: A | Discharge status: Home / Self Care | Payer: BC Managed Care – PPO | Source: Ambulatory Visit | Attending: Urology | Admitting: Urology

## 2020-04-19 ENCOUNTER — Other Ambulatory Visit: Payer: Self-pay

## 2020-04-19 DIAGNOSIS — N200 Calculus of kidney: Secondary | ICD-10-CM | POA: Diagnosis present

## 2020-04-21 ENCOUNTER — Ambulatory Visit (HOSPITAL_COMMUNITY): Payer: BC Managed Care – PPO

## 2020-04-21 ENCOUNTER — Telehealth: Payer: Self-pay

## 2020-04-21 ENCOUNTER — Other Ambulatory Visit: Payer: Self-pay

## 2020-04-21 DIAGNOSIS — N200 Calculus of kidney: Secondary | ICD-10-CM

## 2020-04-21 NOTE — Telephone Encounter (Signed)
Pt called and made aware

## 2020-04-21 NOTE — Telephone Encounter (Signed)
-----   Message from Cleon Gustin, MD sent at 04/21/2020  7:50 AM EDT ----- She has a 25mm right renal/UPJ calculus. She needs to see me this week with KUB ----- Message ----- From: Iris Pert, LPN Sent: [phone]   5:18 PM EDT To: Cleon Gustin, MD  Please review

## 2020-04-24 ENCOUNTER — Other Ambulatory Visit: Payer: Self-pay

## 2020-04-24 ENCOUNTER — Ambulatory Visit (HOSPITAL_COMMUNITY)
Admission: RE | Admit: 2020-04-24 | Discharge: 2020-04-24 | Disposition: A | Discharge status: Home / Self Care | Payer: BC Managed Care – PPO | Source: Ambulatory Visit | Attending: Urology | Admitting: Urology

## 2020-04-24 DIAGNOSIS — N200 Calculus of kidney: Secondary | ICD-10-CM | POA: Insufficient documentation

## 2020-04-26 ENCOUNTER — Ambulatory Visit (INDEPENDENT_AMBULATORY_CARE_PROVIDER_SITE_OTHER): Payer: BC Managed Care – PPO | Admitting: Urology

## 2020-04-26 ENCOUNTER — Other Ambulatory Visit: Payer: Self-pay

## 2020-04-26 ENCOUNTER — Encounter: Payer: Self-pay | Admitting: Urology

## 2020-04-26 VITALS — BP 169/96 | HR 105 | Temp 98.3°F | Ht 66.0 in | Wt 160.0 lb

## 2020-04-26 DIAGNOSIS — N200 Calculus of kidney: Secondary | ICD-10-CM

## 2020-04-26 LAB — URINALYSIS, ROUTINE W REFLEX MICROSCOPIC
Bilirubin, UA: NEGATIVE
Glucose, UA: NEGATIVE
Ketones, UA: NEGATIVE
Leukocytes,UA: NEGATIVE
Nitrite, UA: NEGATIVE
Protein,UA: NEGATIVE
Specific Gravity, UA: 1.02 (ref 1.005–1.030)
Urobilinogen, Ur: 0.2 mg/dL (ref 0.2–1.0)
pH, UA: 7 (ref 5.0–7.5)

## 2020-04-26 LAB — MICROSCOPIC EXAMINATION
RBC: >30 /hpf — AB (ref 0–2)
Renal Epithel, UA: NONE SEEN /hpf

## 2020-04-26 NOTE — Patient Instructions (Signed)

## 2020-04-26 NOTE — Progress Notes (Signed)
Urological Symptom Review  Patient is experiencing the following symptoms: Get up at night to urinate Stream starts and stops  Kidney stones   Review of Systems  Gastrointestinal (upper)  : Indigestion/heartburn  Gastrointestinal (lower) : Negative for lower GI symptoms  Constitutional : Negative for symptoms  Skin: Negative for skin symptoms  Eyes: Negative for eye symptoms  Ear/Nose/Throat : Sinus problems  Hematologic/Lymphatic: Negative for Hematologic/Lymphatic symptoms  Cardiovascular : Negative for cardiovascular symptoms  Respiratory : Negative for respiratory symptoms  Endocrine: Negative for endocrine symptoms  Musculoskeletal: Back pain Joint pain  Neurological: Headaches  Psychologic: Negative for psychiatric symptoms

## 2020-04-26 NOTE — Progress Notes (Signed)
04/26/2020 9:26 AM   Kristi Roman Kristi Roman 1961/03/24 [id number]  Referring provider: Redmond School, MD [address] [place],  [place] [address]  nephrolithiasis  HPI: [name] is a 59yo here for followup for nephrolithiasis. She underwent CT on 04/19/2020 which showed a 31mm right UPJ calculus. She has intermittent right flank pain. No gross hematuria. UA shows 30 RBCs.    PMH: Past Medical History:  Diagnosis Date  . Arthritis   . History of kidney stones   . Hypertension     Surgical History: Past Surgical History:  Procedure Laterality Date  . ABDOMINAL HYSTERECTOMY    . APPENDECTOMY    . BACK SURGERY     removal of cyst  . CATARACT EXTRACTION W/PHACO Left 06/22/2018   Procedure: CATARACT EXTRACTION PHACO AND INTRAOCULAR LENS PLACEMENT (IOC);  Surgeon: Tonny Branch, MD;  Location: [facility];  Service: Ophthalmology;  Laterality: Left;  CDE: 6.03  . CATARACT EXTRACTION W/PHACO Right 07/20/2018   Procedure: CATARACT EXTRACTION PHACO AND INTRAOCULAR LENS PLACEMENT RIGHT EYE CDE=4.50;  Surgeon: Tonny Branch, MD;  Location: [facility];  Service: Ophthalmology;  Laterality: Right;  right  . CHOLECYSTECTOMY      Home Medications:  Allergies as of 04/26/2020      Reactions   Penicillins Anaphylaxis   Has patient had a PCN reaction causing immediate rash, facial/tongue/throat swelling, SOB or lightheadedness with hypotension: yes Has patient had a PCN reaction causing severe rash involving mucus membranes or skin necrosis: no Has patient had a PCN reaction that required hospitalization: no Has patient had a PCN reaction occurring within the last 10 years: no If all of the above answers are "NO", then may proceed with Cephalosporin use.      Medication List       Accurate as of April 26, 2020  9:26 AM. If you have any questions, ask your nurse or doctor.        STOP taking these medications   ciprofloxacin 500 MG tablet Commonly known as: CIPRO Stopped by: Nicolette Bang, MD     TAKE these medications   amLODipine 10 MG tablet Commonly known as: NORVASC Take 5 mg by mouth at bedtime.   diphenhydrAMINE 25 mg capsule Commonly known as: BENADRYL Take 25 mg by mouth at bedtime as needed for allergies.   fexofenadine-pseudoephedrine 60-120 MG 12 hr tablet Commonly known as: ALLEGRA-D Take 1 tablet by mouth daily. May repeat dose in the evening if needed for severe allergies.   fluticasone 50 MCG/ACT nasal spray Commonly known as: FLONASE Place 1 spray into both nostrils daily.   HYDROcodone-acetaminophen 10-325 MG tablet Commonly known as: NORCO Take 1 tablet by mouth every 6 (six) hours as needed for moderate pain.   Melatonin 10 MG Subl Take 10 mg by mouth at bedtime.   metaxalone 800 MG tablet Commonly known as: SKELAXIN Take 800 mg by mouth 4 (four) times daily as needed for muscle spasms.   OVER THE COUNTER MEDICATION Take 1 capsule by mouth 2 (two) times daily with a meal. Natrol Cognium (100 mg Cera-Q) Take 1 capsule with breakfast & 1 capsule in the evening with supper   sodium chloride 0.65 % Soln nasal spray Commonly known as: OCEAN Place 1 spray into both nostrils 4 (four) times daily as needed for congestion.   valACYclovir 1000 MG tablet Commonly known as: VALTREX Take 1,000 mg by mouth daily as needed (fever blisters).       Allergies:  Allergies  Allergen Reactions  .  Penicillins Anaphylaxis    Has patient had a PCN reaction causing immediate rash, facial/tongue/throat swelling, SOB or lightheadedness with hypotension: yes Has patient had a PCN reaction causing severe rash involving mucus membranes or skin necrosis: no Has patient had a PCN reaction that required hospitalization: no Has patient had a PCN reaction occurring within the last 10 years: no If all of the above answers are "NO", then may proceed with Cephalosporin use.     Family History: No family history on file.  Social History:  reports  that she quit smoking about 9 years ago. Her smoking use included cigarettes. She has a 20.00 pack-year smoking history. She has never used smokeless tobacco. She reports previous alcohol use. She reports that she does not use drugs.  ROS: All other review of systems were reviewed and are negative except what is noted above in HPI  Physical Exam: BP (!) 169/96   Pulse (!) 105   Temp 98.3 F (36.8 C) (Oral)   Ht 5\' 6"  (1.676 m)   Wt 160 lb (72.6 kg)   BMI 25.82 kg/m   Constitutional:  Alert and oriented, No acute distress. HEENT: [place] AT, moist mucus membranes.  Trachea midline, no masses. Cardiovascular: No clubbing, cyanosis, or edema. Respiratory: Normal respiratory effort, no increased work of breathing. GI: Abdomen is soft, nontender, nondistended, no abdominal masses GU: No CVA tenderness.  Lymph: No cervical or inguinal lymphadenopathy. Skin: No rashes, bruises or suspicious lesions. Neurologic: Grossly intact, no focal deficits, moving all 4 extremities. Psychiatric: Normal mood and affect.  Laboratory Data: Lab Results  Component Value Date   WBC 6.0 06/10/2018   HGB 12.9 06/10/2018   HCT 38.5 06/10/2018   MCV 95.1 06/10/2018   PLT 351 06/10/2018    Lab Results  Component Value Date   CREATININE 0.74 06/10/2018    No results found for: PSA  No results found for: TESTOSTERONE  No results found for: HGBA1C  Urinalysis    Component Value Date/Time   BILIRUBINUR neg 04/12/2020 1420   PROTEINUR Positive (A) 04/12/2020 1420   UROBILINOGEN 0.2 04/12/2020 1420   NITRITE neg 04/12/2020 1420   LEUKOCYTESUR Negative 04/12/2020 1420    No results found for: LABMICR, [place], RBCUA, LABEPIT, MUCUS, BACTERIA  Pertinent Imaging: CT 04/19/2020: Images reviewed and discussed with the patient Results for orders placed during the hospital encounter of 04/24/20  DG Abd 1 View  Narrative CLINICAL DATA:  Kidney stone, right flank pain and microhematuria  EXAM: ABDOMEN  - 1 VIEW  COMPARISON:  CT renal colic 16/06/9603, abdominal radiograph 03/28/2005  FINDINGS: Redemonstration of the coarse calcification which projects over the region of the right renal pelvis measuring up to 14 mm in size, similar to comparison CT imaging. An additional focal calcification is also present within the right lower pole measuring up to the 3 mm, also present on the comparison study. Few additional punctate calcifications in both kidneys are not as well visualized. No visible calcifications along the course of either ureter. Surgical clips in the gallbladder fossa. No obstructive bowel gas pattern. Osseous structures are free of acute abnormality.  IMPRESSION: Stable 14 mm calcification which projects over the region of the right renal pelvis and 3 mm calculus in the right pole. Additional punctate calcifications in both kidneys seen on prior CT are not as well visualized though may be below radiographic resolution.  No visible calcifications along the course of either ureter.   Electronically Signed By: Lovena Le M.D. On: 04/24/2020  21:21  No results found for this or any previous visit.  No results found for this or any previous visit.  No results found for this or any previous visit.  No results found for this or any previous visit.  No results found for this or any previous visit.  No results found for this or any previous visit.  Results for orders placed during the hospital encounter of 04/19/20  CT RENAL STONE STUDY  Narrative CLINICAL DATA:  Chronic right flank pain. Nephrolithiasis. Recurrent urinary tract infections.  EXAM: CT ABDOMEN AND PELVIS WITHOUT CONTRAST  TECHNIQUE: Multidetector CT imaging of the abdomen and pelvis was performed following the standard protocol without IV contrast.  COMPARISON:  None.  FINDINGS: Lower chest: No acute findings.  Hepatobiliary: No mass visualized on this unenhanced exam. Moderate diffuse  hepatic steatosis is demonstrated. Prior cholecystectomy. No evidence of biliary obstruction.  Pancreas: No mass or inflammatory process visualized on this unenhanced exam.  Spleen:  Within normal limits in size.  Adrenals/Urinary tract: Several tiny less than 5 mm renal calculi are seen bilaterally. Mild right hydronephrosis is seen, with a 13 mm calculus in the right renal pelvis. No evidence ureteral calculi or dilatation. A few small fluid attenuation cysts are noted in both kidneys. Unremarkable unopacified urinary bladder.  Stomach/Bowel: Moderate to large hiatal hernia is seen. No evidence of obstruction, inflammatory process, or abnormal fluid collections. Diverticulosis is seen mainly involving the sigmoid colon, however there is no evidence of diverticulitis.  Vascular/Lymphatic: No pathologically enlarged lymph nodes identified. No evidence of abdominal aortic aneurysm.  Reproductive: Prior hysterectomy noted. Adnexal regions are unremarkable in appearance.  Other:  None.  Musculoskeletal:  No suspicious bone lesions identified.  IMPRESSION: Mild right hydronephrosis, with 13 mm calculus in right renal pelvis.  Bilateral nephrolithiasis.  Moderate to large hiatal hernia.  Moderate hepatic steatosis.  Colonic diverticulosis. No radiographic evidence of diverticulitis.   Electronically Signed By: Marlaine Hind M.D. On: 04/19/2020 17:08   Assessment & Plan:    1. Nephrolithiasis --We discussed the management of kidney stones. These options include observation, ureteroscopy, shockwave lithotripsy (ESWL) and percutaneous nephrolithotomy (PCNL). We discussed which options are relevant to the patient's stone(s). We discussed the natural history of kidney stones as well as the complications of untreated stones and the impact on quality of life without treatment as well as with each of the above listed treatments. We also discussed the efficacy of each treatment in  its ability to clear the stone burden. With any of these management options I discussed the signs and symptoms of infection and the need for emergent treatment should these be experienced. For each option we discussed the ability of each procedure to clear the patient of their stone burden.   For observation I described the risks which include but are not limited to silent renal damage, life-threatening infection, need for emergent surgery, failure to pass stone and pain.   For ureteroscopy I described the risks which include bleeding, infection, damage to contiguous structures, positioning injury, ureteral stricture, ureteral avulsion, ureteral injury, need for prolonged ureteral stent, inability to perform ureteroscopy, need for an interval procedure, inability to clear stone burden, stent discomfort/pain, heart attack, stroke, pulmonary embolus and the inherent risks with general anesthesia.   For shockwave lithotripsy I described the risks which include arrhythmia, kidney contusion, kidney hemorrhage, need for transfusion, pain, inability to adequately break up stone, inability to pass stone fragments, Steinstrasse, infection associated with obstructing stones, need for alternate surgical  procedure, need for repeat shockwave lithotripsy, MI, CVA, PE and the inherent risks with anesthesia/conscious sedation.   For PCNL I described the risks including positioning injury, pneumothorax, hydrothorax, need for chest tube, inability to clear stone burden, renal laceration, arterial venous fistula or malformation, need for embolization of kidney, loss of kidney or renal function, need for repeat procedure, need for prolonged nephrostomy tube, ureteral avulsion, MI, CVA, PE and the inherent risks of general anesthesia.   - The patient would like to proceed with ESWL - Urinalysis, Routine w reflex microscopic   No follow-ups on file.  Nicolette Bang, MD  [facility] [place]

## 2020-05-19 ENCOUNTER — Other Ambulatory Visit: Payer: Self-pay

## 2020-05-19 DIAGNOSIS — N200 Calculus of kidney: Secondary | ICD-10-CM

## 2020-05-23 ENCOUNTER — Other Ambulatory Visit: Payer: Self-pay

## 2020-05-23 ENCOUNTER — Encounter (HOSPITAL_COMMUNITY)
Admission: RE | Admit: 2020-05-23 | Discharge: 2020-05-23 | Disposition: A | Discharge status: Home / Self Care | Payer: BC Managed Care – PPO | Source: Ambulatory Visit | Attending: Urology | Admitting: Urology

## 2020-05-23 ENCOUNTER — Encounter (HOSPITAL_COMMUNITY): Payer: Self-pay

## 2020-05-23 HISTORY — DX: Nausea with vomiting, unspecified: Z98.890

## 2020-05-23 HISTORY — DX: Nausea with vomiting, unspecified: R11.2

## 2020-05-23 NOTE — Pre-Procedure Instructions (Signed)
Called patient to do preop call. Patient said she is COVID positive. She said she tried to call the office to notify them but didn't feel like waiting on hold, so she didn't get them. Interoffice note sent to Gibson Ramp, RN at [facility] to let her know. Patient will call the office back when she is better to reschedule.

## 2020-05-26 ENCOUNTER — Other Ambulatory Visit (HOSPITAL_COMMUNITY)
Admission: RE | Admit: 2020-05-26 | Discharge: 2020-05-26 | Disposition: A | Discharge status: Home / Self Care | Payer: BC Managed Care – PPO | Source: Ambulatory Visit | Attending: Urology | Admitting: Urology

## 2020-05-30 ENCOUNTER — Ambulatory Visit: Admit: 2020-05-30 | Payer: BC Managed Care – PPO | Admitting: Urology

## 2020-05-30 DIAGNOSIS — N200 Calculus of kidney: Secondary | ICD-10-CM

## 2020-05-30 SURGERY — LITHOTRIPSY, ESWL
Anesthesia: LOCAL | Laterality: Right

## 2020-06-16 ENCOUNTER — Ambulatory Visit: Payer: BC Managed Care – PPO | Admitting: Urology

## 2020-07-25 ENCOUNTER — Other Ambulatory Visit (HOSPITAL_COMMUNITY): Payer: Self-pay | Admitting: Internal Medicine

## 2020-07-25 DIAGNOSIS — Z1231 Encounter for screening mammogram for malignant neoplasm of breast: Secondary | ICD-10-CM

## 2020-08-03 ENCOUNTER — Telehealth: Payer: Self-pay

## 2020-08-04 ENCOUNTER — Other Ambulatory Visit: Payer: Self-pay

## 2020-08-04 DIAGNOSIS — N200 Calculus of kidney: Secondary | ICD-10-CM

## 2020-08-04 NOTE — Telephone Encounter (Signed)
Scheduled litho with pt on 11/30

## 2020-08-15 ENCOUNTER — Other Ambulatory Visit: Payer: Self-pay

## 2020-08-15 ENCOUNTER — Encounter (HOSPITAL_COMMUNITY)
Admission: RE | Admit: 2020-08-15 | Discharge: 2020-08-15 | Disposition: A | Discharge status: Home / Self Care | Payer: BC Managed Care – PPO | Source: Ambulatory Visit | Attending: Urology | Admitting: Urology

## 2020-08-15 ENCOUNTER — Encounter (HOSPITAL_COMMUNITY): Payer: Self-pay

## 2020-08-15 HISTORY — DX: Family history of other specified conditions: Z84.89

## 2020-08-21 ENCOUNTER — Other Ambulatory Visit: Payer: Self-pay

## 2020-08-21 ENCOUNTER — Other Ambulatory Visit (HOSPITAL_COMMUNITY)
Admission: RE | Admit: 2020-08-21 | Discharge: 2020-08-21 | Disposition: A | Discharge status: Home / Self Care | Payer: BC Managed Care – PPO | Source: Ambulatory Visit | Attending: Urology | Admitting: Urology

## 2020-08-21 DIAGNOSIS — Z87442 Personal history of urinary calculi: Secondary | ICD-10-CM | POA: Diagnosis not present

## 2020-08-21 DIAGNOSIS — I10 Essential (primary) hypertension: Secondary | ICD-10-CM | POA: Diagnosis not present

## 2020-08-21 DIAGNOSIS — Z79899 Other long term (current) drug therapy: Secondary | ICD-10-CM | POA: Diagnosis not present

## 2020-08-21 DIAGNOSIS — Z01818 Encounter for other preprocedural examination: Secondary | ICD-10-CM | POA: Insufficient documentation

## 2020-08-21 DIAGNOSIS — N200 Calculus of kidney: Secondary | ICD-10-CM | POA: Diagnosis present

## 2020-08-21 DIAGNOSIS — Z20822 Contact with and (suspected) exposure to covid-19: Secondary | ICD-10-CM | POA: Insufficient documentation

## 2020-08-22 ENCOUNTER — Ambulatory Visit (HOSPITAL_COMMUNITY)
Admission: RE | Admit: 2020-08-22 | Discharge: 2020-08-22 | Disposition: A | Discharge status: Home / Self Care | Payer: BC Managed Care – PPO | Attending: Urology | Admitting: Urology

## 2020-08-22 ENCOUNTER — Ambulatory Visit (HOSPITAL_COMMUNITY): Payer: BC Managed Care – PPO

## 2020-08-22 ENCOUNTER — Encounter (HOSPITAL_COMMUNITY)
Admission: RE | Disposition: A | Discharge status: Home / Self Care | Payer: Self-pay | Source: Home / Self Care | Attending: Urology

## 2020-08-22 ENCOUNTER — Encounter (HOSPITAL_COMMUNITY): Payer: Self-pay | Admitting: Urology

## 2020-08-22 DIAGNOSIS — I10 Essential (primary) hypertension: Secondary | ICD-10-CM | POA: Insufficient documentation

## 2020-08-22 DIAGNOSIS — N200 Calculus of kidney: Secondary | ICD-10-CM

## 2020-08-22 DIAGNOSIS — Z20822 Contact with and (suspected) exposure to covid-19: Secondary | ICD-10-CM | POA: Insufficient documentation

## 2020-08-22 DIAGNOSIS — Z79899 Other long term (current) drug therapy: Secondary | ICD-10-CM | POA: Insufficient documentation

## 2020-08-22 DIAGNOSIS — Z87442 Personal history of urinary calculi: Secondary | ICD-10-CM | POA: Insufficient documentation

## 2020-08-22 HISTORY — PX: EXTRACORPOREAL SHOCK WAVE LITHOTRIPSY: SHX1557

## 2020-08-22 LAB — SARS CORONAVIRUS 2 (TAT 6-24 HRS): SARS Coronavirus 2: NEGATIVE

## 2020-08-22 SURGERY — LITHOTRIPSY, ESWL
Anesthesia: LOCAL | Laterality: Right

## 2020-08-22 MED ORDER — DIAZEPAM 5 MG PO TABS
10.0000 mg | ORAL_TABLET | Freq: Once | ORAL | Status: AC
  Administered 2020-08-22: 10 mg via ORAL
  Filled 2020-08-22: qty 2

## 2020-08-22 MED ORDER — SODIUM CHLORIDE 0.9 % IV SOLN
Freq: Once | INTRAVENOUS | Status: AC
  Administered 2020-08-22: 1000 mL via INTRAVENOUS

## 2020-08-22 MED ORDER — DIPHENHYDRAMINE HCL 25 MG PO CAPS
25.0000 mg | ORAL_CAPSULE | ORAL | Status: AC
  Administered 2020-08-22: 25 mg via ORAL
  Filled 2020-08-22: qty 1

## 2020-08-22 MED ORDER — ONDANSETRON HCL 4 MG PO TABS: 4.0000 mg | ORAL_TABLET | Freq: Every day | ORAL | 1 refills | Status: DC | PRN

## 2020-08-22 MED ORDER — TAMSULOSIN HCL 0.4 MG PO CAPS: 0.4000 mg | ORAL_CAPSULE | Freq: Every day | ORAL | 1 refills | Status: AC

## 2020-08-22 MED ORDER — OXYCODONE-ACETAMINOPHEN 10-325 MG PO TABS: 1.0000 | ORAL_TABLET | ORAL | 0 refills | Status: DC | PRN

## 2020-08-22 NOTE — H&P (Signed)
HPI: Kristi Roman is a 59yo here for followup for nephrolithiasis. She underwent CT on 04/19/2020 which showed a 68mm right UPJ calculus. She has intermittent right flank pain. No gross hematuria. UA shows 30 RBCs.    PMH:     Past Medical History:  Diagnosis Date  . Arthritis   . History of kidney stones   . Hypertension     Surgical History:      Past Surgical History:  Procedure Laterality Date  . ABDOMINAL HYSTERECTOMY    . APPENDECTOMY    . BACK SURGERY     removal of cyst  . CATARACT EXTRACTION W/PHACO Left 06/22/2018   Procedure: CATARACT EXTRACTION PHACO AND INTRAOCULAR LENS PLACEMENT (IOC);  Surgeon: Tonny Branch, MD;  Location: [facility];  Service: Ophthalmology;  Laterality: Left;  CDE: 6.03  . CATARACT EXTRACTION W/PHACO Right 07/20/2018   Procedure: CATARACT EXTRACTION PHACO AND INTRAOCULAR LENS PLACEMENT RIGHT EYE CDE=4.50;  Surgeon: Tonny Branch, MD;  Location: [facility];  Service: Ophthalmology;  Laterality: Right;  right  . CHOLECYSTECTOMY      Home Medications:       Allergies as of 04/26/2020      Reactions   Penicillins Anaphylaxis   Has patient had a PCN reaction causing immediate rash, facial/tongue/throat swelling, SOB or lightheadedness with hypotension: yes Has patient had a PCN reaction causing severe rash involving mucus membranes or skin necrosis: no Has patient had a PCN reaction that required hospitalization: no Has patient had a PCN reaction occurring within the last 10 years: no If all of the above answers are "NO", then may proceed with Cephalosporin use.      Medication List       Accurate as of April 26, 2020  9:26 AM. If you have any questions, ask your nurse or doctor.        STOP taking these medications   ciprofloxacin 500 MG tablet Commonly known as: CIPRO Stopped by: Nicolette Bang, MD     TAKE these medications   amLODipine 10 MG tablet Commonly known as: NORVASC Take 5 mg by mouth at bedtime.     diphenhydrAMINE 25 mg capsule Commonly known as: BENADRYL Take 25 mg by mouth at bedtime as needed for allergies.   fexofenadine-pseudoephedrine 60-120 MG 12 hr tablet Commonly known as: ALLEGRA-D Take 1 tablet by mouth daily. May repeat dose in the evening if needed for severe allergies.   fluticasone 50 MCG/ACT nasal spray Commonly known as: FLONASE Place 1 spray into both nostrils daily.   HYDROcodone-acetaminophen 10-325 MG tablet Commonly known as: NORCO Take 1 tablet by mouth every 6 (six) hours as needed for moderate pain.   Melatonin 10 MG Subl Take 10 mg by mouth at bedtime.   metaxalone 800 MG tablet Commonly known as: SKELAXIN Take 800 mg by mouth 4 (four) times daily as needed for muscle spasms.   OVER THE COUNTER MEDICATION Take 1 capsule by mouth 2 (two) times daily with a meal. Natrol Cognium (100 mg Cera-Q) Take 1 capsule with breakfast & 1 capsule in the evening with supper   sodium chloride 0.65 % Soln nasal spray Commonly known as: OCEAN Place 1 spray into both nostrils 4 (four) times daily as needed for congestion.   valACYclovir 1000 MG tablet Commonly known as: VALTREX Take 1,000 mg by mouth daily as needed (fever blisters).       Allergies:       Allergies  Allergen Reactions  . Penicillins Anaphylaxis    Has  patient had a PCN reaction causing immediate rash, facial/tongue/throat swelling, SOB or lightheadedness with hypotension: yes Has patient had a PCN reaction causing severe rash involving mucus membranes or skin necrosis: no Has patient had a PCN reaction that required hospitalization: no Has patient had a PCN reaction occurring within the last 10 years: no If all of the above answers are "NO", then may proceed with Cephalosporin use.     Family History: No family history on file.  Social History:  reports that she quit smoking about 9 years ago. Her smoking use included cigarettes. She has a 20.00 pack-year smoking  history. She has never used smokeless tobacco. She reports previous alcohol use. She reports that she does not use drugs.  ROS: All other review of systems were reviewed and are negative except what is noted above in HPI  Physical Exam: BP (!) 169/96   Pulse (!) 105   Temp 98.3 F (36.8 C) (Oral)   Ht 5\' 6"  (1.676 m)   Wt 160 lb (72.6 kg)   BMI 25.82 kg/m   Constitutional:  Alert and oriented, No acute distress. HEENT: [place] AT, moist mucus membranes.  Trachea midline, no masses. Cardiovascular: No clubbing, cyanosis, or edema. Respiratory: Normal respiratory effort, no increased work of breathing. GI: Abdomen is soft, nontender, nondistended, no abdominal masses GU: No CVA tenderness.  Lymph: No cervical or inguinal lymphadenopathy. Skin: No rashes, bruises or suspicious lesions. Neurologic: Grossly intact, no focal deficits, moving all 4 extremities. Psychiatric: Normal mood and affect.  Laboratory Data: Recent Labs       Lab Results  Component Value Date   WBC 6.0 06/10/2018   HGB 12.9 06/10/2018   HCT 38.5 06/10/2018   MCV 95.1 06/10/2018   PLT 351 06/10/2018      Recent Labs       Lab Results  Component Value Date   CREATININE 0.74 06/10/2018      Recent Labs  No results found for: PSA    Recent Labs  No results found for: TESTOSTERONE    Recent Labs  No results found for: HGBA1C    Urinalysis Labs (Brief)          Component Value Date/Time   BILIRUBINUR neg 04/12/2020 1420   PROTEINUR Positive (A) 04/12/2020 1420   UROBILINOGEN 0.2 04/12/2020 1420   NITRITE neg 04/12/2020 1420   LEUKOCYTESUR Negative 04/12/2020 1420      Recent Labs  No results found for: LABMICR, [place], RBCUA, LABEPIT, MUCUS, BACTERIA    Pertinent Imaging: CT 04/19/2020: Images reviewed and discussed with the patient Results for orders placed during the hospital encounter of 04/24/20  DG Abd 1 View  Narrative CLINICAL DATA:  Kidney stone,  right flank pain and microhematuria  EXAM: ABDOMEN - 1 VIEW  COMPARISON:  CT renal colic 24/[id number]/[id number], abdominal radiograph 03/28/2005  FINDINGS: Redemonstration of the coarse calcification which projects over the region of the right renal pelvis measuring up to 14 mm in size, similar to comparison CT imaging. An additional focal calcification is also present within the right lower pole measuring up to the 3 mm, also present on the comparison study. Few additional punctate calcifications in both kidneys are not as well visualized. No visible calcifications along the course of either ureter. Surgical clips in the gallbladder fossa. No obstructive bowel gas pattern. Osseous structures are free of acute abnormality.  IMPRESSION: Stable 14 mm calcification which projects over the region of the right renal pelvis and 3 mm calculus in the  right pole. Additional punctate calcifications in both kidneys seen on prior CT are not as well visualized though may be below radiographic resolution.  No visible calcifications along the course of either ureter.   Electronically Signed By: Lovena Le M.D. On: 04/24/2020 21:21  No results found for this or any previous visit.  No results found for this or any previous visit.  No results found for this or any previous visit.  No results found for this or any previous visit.  No results found for this or any previous visit.  No results found for this or any previous visit.  Results for orders placed during the hospital encounter of 04/19/20  CT RENAL STONE STUDY  Narrative CLINICAL DATA:  Chronic right flank pain. Nephrolithiasis. Recurrent urinary tract infections.  EXAM: CT ABDOMEN AND PELVIS WITHOUT CONTRAST  TECHNIQUE: Multidetector CT imaging of the abdomen and pelvis was performed following the standard protocol without IV contrast.  COMPARISON:  None.  FINDINGS: Lower chest: No acute  findings.  Hepatobiliary: No mass visualized on this unenhanced exam. Moderate diffuse hepatic steatosis is demonstrated. Prior cholecystectomy. No evidence of biliary obstruction.  Pancreas: No mass or inflammatory process visualized on this unenhanced exam.  Spleen:  Within normal limits in size.  Adrenals/Urinary tract: Several tiny less than 5 mm renal calculi are seen bilaterally. Mild right hydronephrosis is seen, with a 13 mm calculus in the right renal pelvis. No evidence ureteral calculi or dilatation. A few small fluid attenuation cysts are noted in both kidneys. Unremarkable unopacified urinary bladder.  Stomach/Bowel: Moderate to large hiatal hernia is seen. No evidence of obstruction, inflammatory process, or abnormal fluid collections. Diverticulosis is seen mainly involving the sigmoid colon, however there is no evidence of diverticulitis.  Vascular/Lymphatic: No pathologically enlarged lymph nodes identified. No evidence of abdominal aortic aneurysm.  Reproductive: Prior hysterectomy noted. Adnexal regions are unremarkable in appearance.  Other:  None.  Musculoskeletal:  No suspicious bone lesions identified.  IMPRESSION: Mild right hydronephrosis, with 13 mm calculus in right renal pelvis.  Bilateral nephrolithiasis.  Moderate to large hiatal hernia.  Moderate hepatic steatosis.  Colonic diverticulosis. No radiographic evidence of diverticulitis.   Electronically Signed By: Marlaine Hind M.D. On: 04/19/2020 17:08   Assessment & Plan:    1. Nephrolithiasis --We discussed the management of kidney stones. These options include observation, ureteroscopy, shockwave lithotripsy (ESWL) and percutaneous nephrolithotomy (PCNL). We discussed which options are relevant to the patient's stone(s). We discussed the natural history of kidney stones as well as the complications of untreated stones and the impact on quality of life without  treatment as well as with each of the above listed treatments. We also discussed the efficacy of each treatment in its ability to clear the stone burden. With any of these management options I discussed the signs and symptoms of infection and the need for emergent treatment should these be experienced. For each option we discussed the ability of each procedure to clear the patient of their stone burden.   For observation I described the risks which include but are not limited to silent renal damage, life-threatening infection, need for emergent surgery, failure to pass stone and pain.   For ureteroscopy I described the risks which include bleeding, infection, damage to contiguous structures, positioning injury, ureteral stricture, ureteral avulsion, ureteral injury, need for prolonged ureteral stent, inability to perform ureteroscopy, need for an interval procedure, inability to clear stone burden, stent discomfort/pain, heart attack, stroke, pulmonary embolus and the inherent risks  with general anesthesia.   For shockwave lithotripsy I described the risks which include arrhythmia, kidney contusion, kidney hemorrhage, need for transfusion, pain, inability to adequately break up stone, inability to pass stone fragments, Steinstrasse, infection associated with obstructing stones, need for alternate surgical procedure, need for repeat shockwave lithotripsy, MI, CVA, PE and the inherent risks with anesthesia/conscious sedation.   For PCNL I described the risks including positioning injury, pneumothorax, hydrothorax, need for chest tube, inability to clear stone burden, renal laceration, arterial venous fistula or malformation, need for embolization of kidney, loss of kidney or renal function, need for repeat procedure, need for prolonged nephrostomy tube, ureteral avulsion, MI, CVA, PE and the inherent risks of general anesthesia.   - The patient would like to proceed with ESWL

## 2020-08-22 NOTE — Discharge Instructions (Signed)
Lithotripsy, Care After This sheet gives you information about how to care for yourself after your procedure. Your health care provider may also give you more specific instructions. If you have problems or questions, contact your health care provider. What can I expect after the procedure? After the procedure, it is common to have:  Some blood in your urine. This should only last for a few days.  Soreness in your back, sides, or upper abdomen for a few days.  Blotches or bruises on your back where the pressure wave entered the skin.  Pain, discomfort, or nausea when pieces (fragments) of the kidney stone move through the tube that carries urine from the kidney to the bladder (ureter). Stone fragments may pass soon after the procedure, but they may continue to pass for up to 4-8 weeks. ? If you have severe pain or nausea, contact your health care provider. This may be caused by a large stone that was not broken up, and this may mean that you need more treatment.  Some pain or discomfort during urination.  Some pain or discomfort in the lower abdomen or (in men) at the base of the penis. Follow these instructions at home: Medicines  Take over-the-counter and prescription medicines only as told by your health care provider.  If you were prescribed an antibiotic medicine, take it as told by your health care provider. Do not stop taking the antibiotic even if you start to feel better.  Do not drive for 24 hours if you were given a medicine to help you relax (sedative).  Do not drive or use heavy machinery while taking prescription pain medicine. Eating and drinking      Drink enough water and fluids to keep your urine clear or pale yellow. This helps any remaining pieces of the stone to pass. It can also help prevent new stones from forming.  Eat plenty of fresh fruits and vegetables.  Follow instructions from your health care provider about eating and drinking restrictions. You may be  instructed: ? To reduce how much salt (sodium) you eat or drink. Check ingredients and nutrition facts on packaged foods and beverages. ? To reduce how much meat you eat.  Eat the recommended amount of calcium for your age and gender. Ask your health care provider how much calcium you should have. General instructions  Get plenty of rest.  Most people can resume normal activities 1-2 days after the procedure. Ask your health care provider what activities are safe for you.  Your health care provider may direct you to lie in a certain position (postural drainage) and tap firmly (percuss) over your kidney area to help stone fragments pass. Follow instructions as told by your health care provider.  If directed, strain all urine through the strainer that was provided by your health care provider. ? Keep all fragments for your health care provider to see. Any stones that are found may be sent to a medical lab for examination. The stone may be as small as a grain of salt.  Keep all follow-up visits as told by your health care provider. This is important. Contact a health care provider if:  You have pain that is severe or does not get better with medicine.  You have nausea that is severe or does not go away.  You have blood in your urine longer than your health care provider told you to expect.  You have more blood in your urine.  You have pain during urination that does   urination that does not go away.  You urinate more frequently than usual and this does not go away.  You develop a rash or any other possible signs of an allergic reaction. Get help right away if:  You have severe pain in your back, sides, or upper abdomen.  You have severe pain while urinating.  Your urine is very dark red.  You have blood in your stool (feces).  You cannot pass any urine at all.  You feel a strong urge to urinate after emptying your bladder.  You have a fever or chills.  You develop shortness of  breath, difficulty breathing, or chest pain.  You have severe nausea that leads to persistent vomiting.  You faint. Summary  After this procedure, it is common to have some pain, discomfort, or nausea when pieces (fragments) of the kidney stone move through the tube that carries urine from the kidney to the bladder (ureter). If this pain or nausea is severe, however, you should contact your health care provider.  Most people can resume normal activities 1-2 days after the procedure. Ask your health care provider what activities are safe for you.  Drink enough water and fluids to keep your urine clear or pale yellow. This helps any remaining pieces of the stone to pass, and it can help prevent new stones from forming.  If directed, strain your urine and keep all fragments for your health care provider to see. Fragments or stones may be as small as a grain of salt.  Get help right away if you have severe pain in your back, sides, or upper abdomen or have severe pain while urinating. This information is not intended to replace advice given to you by your health care provider. Make sure you discuss any questions you have with your health care provider. Document Revised: 12/21/2018 Document Reviewed: 07/31/2016 Elsevier Patient Education  2020 [employer] Monitored Anesthesia Care, Care After These instructions provide you with information about caring for yourself after your procedure. Your health care provider may also give you more specific instructions. Your treatment has been planned according to current medical practices, but problems sometimes occur. Call your health care provider if you have any problems or questions after your procedure. What can I expect after the procedure? After your procedure, you may:  Feel sleepy for several hours.  Feel clumsy and have poor balance for several hours.  Feel forgetful about what happened after the procedure.  Have poor judgment for several  hours.  Feel nauseous or vomit.  Have a sore throat if you had a breathing tube during the procedure. Follow these instructions at home: For at least 24 hours after the procedure:        Have a responsible adult stay with you. It is important to have someone help care for you until you are awake and alert.  Rest as needed.  Do not: ? Participate in activities in which you could fall or become injured. ? Drive. ? Use heavy machinery. ? Drink alcohol. ? Take sleeping pills or medicines that cause drowsiness. ? Make important decisions or sign legal documents. ? Take care of children on your own. Eating and drinking  Follow the diet that is recommended by your health care provider.  If you vomit, drink water, juice, or soup when you can drink without vomiting.  Make sure you have little or no nausea before eating solid foods. General instructions  Take over-the-counter and prescription medicines only as told by your health   care provider.  If you have sleep apnea, surgery and certain medicines can increase your risk for breathing problems. Follow instructions from your health care provider about wearing your sleep device: ? Anytime you are sleeping, including during daytime naps. ? While taking prescription pain medicines, sleeping medicines, or medicines that make you drowsy.  If you smoke, do not smoke without supervision.  Keep all follow-up visits as told by your health care provider. This is important. Contact a health care provider if:  You keep feeling nauseous or you keep vomiting.  You feel light-headed.  You develop a rash.  You have a fever. Get help right away if:  You have trouble breathing. Summary  For several hours after your procedure, you may feel sleepy and have poor judgment.  Have a responsible adult stay with you for at least 24 hours or until you are awake and alert. This information is not intended to replace advice given to you by your  health care provider. Make sure you discuss any questions you have with your health care provider. Document Revised: 12/08/2017 Document Reviewed: 12/31/2015 Elsevier Patient Education  2020 [employer]  

## 2020-08-23 ENCOUNTER — Encounter (HOSPITAL_COMMUNITY): Payer: Self-pay | Admitting: Urology

## 2020-09-06 ENCOUNTER — Other Ambulatory Visit: Payer: Self-pay

## 2020-09-06 ENCOUNTER — Ambulatory Visit: Payer: BC Managed Care – PPO | Admitting: Urology

## 2020-09-06 ENCOUNTER — Ambulatory Visit (HOSPITAL_COMMUNITY)
Admission: RE | Admit: 2020-09-06 | Discharge: 2020-09-06 | Disposition: A | Discharge status: Home / Self Care | Payer: BC Managed Care – PPO | Source: Ambulatory Visit | Attending: Urology | Admitting: Urology

## 2020-09-06 DIAGNOSIS — N200 Calculus of kidney: Secondary | ICD-10-CM | POA: Diagnosis present

## 2020-09-14 ENCOUNTER — Other Ambulatory Visit: Payer: BC Managed Care – PPO

## 2020-09-14 ENCOUNTER — Other Ambulatory Visit: Payer: Self-pay

## 2020-09-14 DIAGNOSIS — N200 Calculus of kidney: Secondary | ICD-10-CM

## 2020-09-14 LAB — URINALYSIS, ROUTINE W REFLEX MICROSCOPIC
Bilirubin, UA: NEGATIVE
Ketones, UA: NEGATIVE
Leukocytes,UA: NEGATIVE
Nitrite, UA: POSITIVE — AB
Specific Gravity, UA: 1.02 (ref 1.005–1.030)
Urobilinogen, Ur: 0.2 mg/dL (ref 0.2–1.0)
pH, UA: 6.5 (ref 5.0–7.5)

## 2020-09-14 LAB — MICROSCOPIC EXAMINATION
Epithelial Cells (non renal): >10 /hpf — AB (ref 0–10)
RBC, Urine: >30 /hpf — AB (ref 0–2)
Renal Epithel, UA: NONE SEEN /hpf

## 2020-09-14 MED ORDER — NITROFURANTOIN MONOHYD MACRO 100 MG PO CAPS: 100.0000 mg | ORAL_CAPSULE | Freq: Two times a day (BID) | ORAL | 0 refills | Status: DC

## 2020-09-14 NOTE — Progress Notes (Signed)
Pt notified of antibiotic sent to pharmacy for pending urine culture. 2 week f/u with KUB scheduled with pt as well per Dr. Alyson Ingles.

## 2020-09-21 LAB — URINE CULTURE

## 2020-09-27 ENCOUNTER — Other Ambulatory Visit: Payer: Self-pay

## 2020-09-27 ENCOUNTER — Ambulatory Visit (HOSPITAL_COMMUNITY)
Admission: RE | Admit: 2020-09-27 | Discharge: 2020-09-27 | Disposition: A | Discharge status: Home / Self Care | Payer: Self-pay | Source: Ambulatory Visit | Attending: Urology | Admitting: Urology

## 2020-09-27 DIAGNOSIS — N200 Calculus of kidney: Secondary | ICD-10-CM | POA: Insufficient documentation

## 2020-09-28 ENCOUNTER — Encounter: Payer: Self-pay | Admitting: Urology

## 2020-09-28 ENCOUNTER — Ambulatory Visit (INDEPENDENT_AMBULATORY_CARE_PROVIDER_SITE_OTHER): Payer: BC Managed Care – PPO | Admitting: Urology

## 2020-09-28 VITALS — BP 155/77 | HR 103 | Temp 98.7°F | Ht 65.0 in | Wt 160.0 lb

## 2020-09-28 DIAGNOSIS — N200 Calculus of kidney: Secondary | ICD-10-CM

## 2020-09-28 NOTE — Progress Notes (Signed)
Urological Symptom Review  Patient is experiencing the following symptoms: Frequent urination Hard to postpone urination Get up at night to urinate Trouble starting stream Weak stream  Kidney stones   Review of Systems  Gastrointestinal (upper)  : Indigestion/heartburn  Gastrointestinal (lower) : Negative for lower GI symptoms  Constitutional : Negative for symptoms  Skin: Negative for skin symptoms  Eyes: Negative for eye symptoms  Ear/Nose/Throat : Negative for Ear/Nose/Throat symptoms  Hematologic/Lymphatic: Negative for Hematologic/Lymphatic symptoms  Cardiovascular : Negative for cardiovascular symptoms  Respiratory : Negative for respiratory symptoms  Endocrine: Negative for endocrine symptoms  Musculoskeletal: Back pain Joint pain  Neurological: Negative for neurological symptoms  Psychologic: Negative for psychiatric symptoms

## 2020-09-28 NOTE — Progress Notes (Signed)
09/28/2020 4:10 PM   Kristi Roman 20-Dec-1960 [id number]  Referring provider: [employer], [place],  [place] [address]  followup nephrolithiasis  HPI: Ms Kristi Roman is a 60yo here for followup for nephrolithiasis. She passed several small fragments since last visit. She denies any LUTS. No hematuria or dysuria. KUB shos 5-46mm right lower pole residual fragments   PMH: Past Medical History:  Diagnosis Date  . Arthritis   . Family history of adverse reaction to anesthesia    mother woke up during surgery  . History of kidney stones   . Hypertension   . PONV (postoperative nausea and vomiting)     Surgical History: Past Surgical History:  Procedure Laterality Date  . ABDOMINAL HYSTERECTOMY    . APPENDECTOMY    . BACK SURGERY     removal of cyst  . CATARACT EXTRACTION W/PHACO Left 06/22/2018   Procedure: CATARACT EXTRACTION PHACO AND INTRAOCULAR LENS PLACEMENT (IOC);  Surgeon: Tonny Branch, MD;  Location: [facility];  Service: Ophthalmology;  Laterality: Left;  CDE: 6.03  . CATARACT EXTRACTION W/PHACO Right 07/20/2018   Procedure: CATARACT EXTRACTION PHACO AND INTRAOCULAR LENS PLACEMENT RIGHT EYE CDE=4.50;  Surgeon: Tonny Branch, MD;  Location: [facility];  Service: Ophthalmology;  Laterality: Right;  right  . CHOLECYSTECTOMY    . EXTRACORPOREAL SHOCK WAVE LITHOTRIPSY Right 08/22/2020   Procedure: EXTRACORPOREAL SHOCK WAVE LITHOTRIPSY (ESWL);  Surgeon: Cleon Gustin, MD;  Location: [facility];  Service: Urology;  Laterality: Right;    Home Medications:  Allergies as of 09/28/2020      Reactions   Penicillins Anaphylaxis   Has patient had a PCN reaction causing immediate rash, facial/tongue/throat swelling, SOB or lightheadedness with hypotension: yes Has patient had a PCN reaction causing severe rash involving mucus membranes or skin necrosis: no Has patient had a PCN reaction that required hospitalization: no Has patient had a PCN reaction  occurring within the last 10 years: no If all of the above answers are "NO", then may proceed with Cephalosporin use.   Latex Hives, Swelling      Medication List       Accurate as of September 28, 2020  4:10 PM. If you have any questions, ask your nurse or doctor.        STOP taking these medications   nitrofurantoin (macrocrystal-monohydrate) 100 MG capsule Commonly known as: MACROBID   ondansetron 4 MG tablet Commonly known as: Zofran   oxyCODONE-acetaminophen 10-325 MG tablet Commonly known as: Percocet     TAKE these medications   amLODipine 10 MG tablet Commonly known as: NORVASC Take 5 mg by mouth at bedtime.   fexofenadine-pseudoephedrine 60-120 MG 12 hr tablet Commonly known as: ALLEGRA-D Take 1 tablet by mouth daily.   fluticasone 50 MCG/ACT nasal spray Commonly known as: FLONASE Place 1 spray into both nostrils daily as needed for allergies.   HYDROcodone-acetaminophen 10-325 MG tablet Commonly known as: NORCO Take 1 tablet by mouth every 4 (four) hours as needed for moderate pain.   Melatonin 10 MG Subl Take 10 mg by mouth at bedtime.   metaxalone 800 MG tablet Commonly known as: SKELAXIN Take 800 mg by mouth 4 (four) times daily as needed for muscle spasms.   sodium chloride 0.65 % Soln nasal spray Commonly known as: OCEAN Place 1 spray into both nostrils daily.   tamsulosin 0.4 MG Caps capsule Commonly known as: Flomax Take 1 capsule (0.4 mg total) by mouth daily after supper.  Allergies:  Allergies  Allergen Reactions  . Penicillins Anaphylaxis    Has patient had a PCN reaction causing immediate rash, facial/tongue/throat swelling, SOB or lightheadedness with hypotension: yes Has patient had a PCN reaction causing severe rash involving mucus membranes or skin necrosis: no Has patient had a PCN reaction that required hospitalization: no Has patient had a PCN reaction occurring within the last 10 years: no If all of the above answers  are "NO", then may proceed with Cephalosporin use.   . Latex Hives and Swelling    Family History: No family history on file.  Social History:  reports that she quit smoking about 10 years ago. Her smoking use included cigarettes. She has a 20.00 pack-year smoking history. She has never used smokeless tobacco. She reports previous alcohol use. She reports that she does not use drugs.  ROS: All other review of systems were reviewed and are negative except what is noted above in HPI  Physical Exam: BP (!) 155/77   Pulse (!) 103   Temp 98.7 F (37.1 C)   Ht 5\' 5"  (1.651 m)   Wt 160 lb (72.6 kg)   BMI 26.63 kg/m   Constitutional:  Alert and oriented, No acute distress. HEENT: [place] AT, moist mucus membranes.  Trachea midline, no masses. Cardiovascular: No clubbing, cyanosis, or edema. Respiratory: Normal respiratory effort, no increased work of breathing. GI: Abdomen is soft, nontender, nondistended, no abdominal masses GU: No CVA tenderness.  Lymph: No cervical or inguinal lymphadenopathy. Skin: No rashes, bruises or suspicious lesions. Neurologic: Grossly intact, no focal deficits, moving all 4 extremities. Psychiatric: Normal mood and affect.  Laboratory Data: Lab Results  Component Value Date   WBC 6.0 06/10/2018   HGB 12.9 06/10/2018   HCT 38.5 06/10/2018   MCV 95.1 06/10/2018   PLT 351 06/10/2018    Lab Results  Component Value Date   CREATININE 0.74 06/10/2018    No results found for: PSA  No results found for: TESTOSTERONE  No results found for: HGBA1C  Urinalysis    Component Value Date/Time   APPEARANCEUR Clear 09/14/2020 1019   GLUCOSEU Trace (A) 09/14/2020 1019   BILIRUBINUR Negative 09/14/2020 1019   PROTEINUR 1+ (A) 09/14/2020 1019   UROBILINOGEN 0.2 04/12/2020 1420   NITRITE Positive (A) 09/14/2020 1019   LEUKOCYTESUR Negative 09/14/2020 1019    Lab Results  Component Value Date   LABMICR See below: 09/14/2020   WBCUA 6-10 (A) 09/14/2020    LABEPIT >10 (A) 09/14/2020   MUCUS Present 09/14/2020   BACTERIA Many (A) 09/14/2020    Pertinent Imaging: KUB 09/27/2020: Images reviewed and discussed with the patient Results for orders placed during the hospital encounter of 09/27/20  Abdomen 1 view (KUB)  Narrative CLINICAL DATA:  Renal stone  EXAM: ABDOMEN - 1 VIEW  COMPARISON:  09/06/2020  FINDINGS: Nonobstructive pattern of bowel gas, with a generally large burden of stool throughout the colon and rectum, which limits assessment for renal calculi. Within this limitation, no obvious change in a single calculus projecting over the expected vicinity of the inferior pole of the right kidney, measuring approximately 7 mm in projection.  IMPRESSION: Nonobstructive pattern of bowel gas, with a generally large burden of stool throughout the colon and rectum, which limits assessment for renal calculi. Within this limitation, no obvious change in a single calculus projecting over the expected vicinity of the inferior pole of the right kidney, measuring approximately 7 mm in projection. CT is the test of choice for  the evaluation of urinary tract calculi.   Electronically Signed By: Lauralyn Primes M.D. On: 09/27/2020 15:10  No results found for this or any previous visit.  No results found for this or any previous visit.  No results found for this or any previous visit.  No results found for this or any previous visit.  No results found for this or any previous visit.  No results found for this or any previous visit.  Results for orders placed during the hospital encounter of 04/19/20  CT RENAL STONE STUDY  Narrative CLINICAL DATA:  Chronic right flank pain. Nephrolithiasis. Recurrent urinary tract infections.  EXAM: CT ABDOMEN AND PELVIS WITHOUT CONTRAST  TECHNIQUE: Multidetector CT imaging of the abdomen and pelvis was performed following the standard protocol without IV contrast.  COMPARISON:   None.  FINDINGS: Lower chest: No acute findings.  Hepatobiliary: No mass visualized on this unenhanced exam. Moderate diffuse hepatic steatosis is demonstrated. Prior cholecystectomy. No evidence of biliary obstruction.  Pancreas: No mass or inflammatory process visualized on this unenhanced exam.  Spleen:  Within normal limits in size.  Adrenals/Urinary tract: Several tiny less than 5 mm renal calculi are seen bilaterally. Mild right hydronephrosis is seen, with a 13 mm calculus in the right renal pelvis. No evidence ureteral calculi or dilatation. A few small fluid attenuation cysts are noted in both kidneys. Unremarkable unopacified urinary bladder.  Stomach/Bowel: Moderate to large hiatal hernia is seen. No evidence of obstruction, inflammatory process, or abnormal fluid collections. Diverticulosis is seen mainly involving the sigmoid colon, however there is no evidence of diverticulitis.  Vascular/Lymphatic: No pathologically enlarged lymph nodes identified. No evidence of abdominal aortic aneurysm.  Reproductive: Prior hysterectomy noted. Adnexal regions are unremarkable in appearance.  Other:  None.  Musculoskeletal:  No suspicious bone lesions identified.  IMPRESSION: Mild right hydronephrosis, with 13 mm calculus in right renal pelvis.  Bilateral nephrolithiasis.  Moderate to large hiatal hernia.  Moderate hepatic steatosis.  Colonic diverticulosis. No radiographic evidence of diverticulitis.   Electronically Signed By: Danae Orleans M.D. On: 04/19/2020 17:08   Assessment & Plan:    1. Nephrolithiasis -RTC 3 months with KUB - Urinalysis, Routine w reflex microscopic   No follow-ups on file.  Wilkie Aye, MD  [facility] [place]

## 2020-09-29 LAB — URINALYSIS, ROUTINE W REFLEX MICROSCOPIC
Bilirubin, UA: NEGATIVE
Glucose, UA: NEGATIVE
Ketones, UA: NEGATIVE
Nitrite, UA: NEGATIVE
Protein,UA: NEGATIVE
Specific Gravity, UA: 1.015 (ref 1.005–1.030)
Urobilinogen, Ur: 0.2 mg/dL (ref 0.2–1.0)
pH, UA: 5.5 (ref 5.0–7.5)

## 2020-09-29 LAB — MICROSCOPIC EXAMINATION

## 2020-10-03 ENCOUNTER — Telehealth: Payer: Self-pay

## 2020-10-03 ENCOUNTER — Other Ambulatory Visit: Payer: Self-pay

## 2020-10-03 MED ORDER — SULFAMETHOXAZOLE-TRIMETHOPRIM 800-160 MG PO TABS: 1.0000 | ORAL_TABLET | Freq: Two times a day (BID) | ORAL | 0 refills | Status: DC

## 2020-10-03 NOTE — Telephone Encounter (Signed)
-----   Message from Patrick L McKenzie, MD sent at 10/03/2020 10:31 AM EST ----- Bactrim DS BID for 7 days ----- Message ----- From: Natsuko Kelsay, LPN Sent: 09/21/2020   8:22 AM EST To: Patrick L McKenzie, MD  Pls review.  

## 2020-10-03 NOTE — Telephone Encounter (Signed)
Called pt to notify Bactrim rx sent. Left message.

## 2020-10-03 NOTE — Telephone Encounter (Signed)
Pt notified rx sent in

## 2020-10-04 ENCOUNTER — Telehealth: Payer: Self-pay

## 2020-10-04 NOTE — Telephone Encounter (Signed)
-----   Message from Cleon Gustin, MD sent at 10/03/2020 10:31 AM EST ----- Bactrim DS BID for 7 days ----- Message ----- From: Valentina Lucks, LPN Sent: [id number]   8:22 AM EST To: Cleon Gustin, MD  Pls review.

## 2020-10-04 NOTE — Telephone Encounter (Signed)
Pt notified rx sent in after positive urine culture.

## 2020-12-26 ENCOUNTER — Other Ambulatory Visit: Payer: Self-pay

## 2020-12-26 ENCOUNTER — Ambulatory Visit (HOSPITAL_COMMUNITY)
Admission: RE | Admit: 2020-12-26 | Discharge: 2020-12-26 | Disposition: A | Discharge status: Home / Self Care | Payer: BC Managed Care – PPO | Source: Ambulatory Visit | Attending: Urology | Admitting: Urology

## 2020-12-26 DIAGNOSIS — N200 Calculus of kidney: Secondary | ICD-10-CM | POA: Insufficient documentation

## 2020-12-27 ENCOUNTER — Encounter: Payer: Self-pay | Admitting: Urology

## 2020-12-27 ENCOUNTER — Ambulatory Visit (INDEPENDENT_AMBULATORY_CARE_PROVIDER_SITE_OTHER): Payer: BC Managed Care – PPO | Admitting: Urology

## 2020-12-27 VITALS — BP 161/100 | HR 99 | Temp 99.0°F | Ht 65.0 in | Wt 165.6 lb

## 2020-12-27 DIAGNOSIS — N200 Calculus of kidney: Secondary | ICD-10-CM

## 2020-12-27 LAB — URINALYSIS, ROUTINE W REFLEX MICROSCOPIC
Bilirubin, UA: NEGATIVE
Glucose, UA: NEGATIVE
Ketones, UA: NEGATIVE
Leukocytes,UA: NEGATIVE
Nitrite, UA: NEGATIVE
Protein,UA: NEGATIVE
RBC, UA: NEGATIVE
Specific Gravity, UA: 1.02 (ref 1.005–1.030)
Urobilinogen, Ur: 0.2 mg/dL (ref 0.2–1.0)
pH, UA: 6 (ref 5.0–7.5)

## 2020-12-27 NOTE — Progress Notes (Signed)
Urological Symptom Review  Patient is experiencing the following symptoms: negative   Review of Systems  Gastrointestinal (upper)  : Negative for upper GI symptoms  Gastrointestinal (lower) : Negative for lower GI symptoms  Constitutional : Negative for symptoms  Skin: Negative for skin symptoms  Eyes: Negative for eye symptoms  Ear/Nose/Throat : Negative   Hematologic/Lymphatic: Negative for Hematologic/Lymphatic symptoms  Cardiovascular : Negative for cardiovascular symptoms  Respiratory : Negative for respiratory symptoms  Endocrine: Negative for endocrine symptoms  Musculoskeletal: Negative for musculoskeletal symptoms  Neurological: Negative for neurological symptoms  Psychologic: Negative for psychiatric symptoms

## 2020-12-27 NOTE — Progress Notes (Signed)
12/27/2020 4:04 PM   Kristi Roman 08/11/1961 [id number]  Referring provider: [employer], [place],  [place] [address]  followup nephrolithiasis  HPI: Kristi Roman is a 60yo here for followup no nephrolithiasis. No stone events since last visit. She denies any flank pain. No worsening LUTS. KUB 12/27/2020 shows multiple small right renal pelvis calculi   PMH: Past Medical History:  Diagnosis Date  . Arthritis   . Family history of adverse reaction to anesthesia    mother woke up during surgery  . History of kidney stones   . Hypertension   . PONV (postoperative nausea and vomiting)     Surgical History: Past Surgical History:  Procedure Laterality Date  . ABDOMINAL HYSTERECTOMY    . APPENDECTOMY    . BACK SURGERY     removal of cyst  . CATARACT EXTRACTION W/PHACO Left 06/22/2018   Procedure: CATARACT EXTRACTION PHACO AND INTRAOCULAR LENS PLACEMENT (IOC);  Surgeon: Tonny Branch, MD;  Location: [facility];  Service: Ophthalmology;  Laterality: Left;  CDE: 6.03  . CATARACT EXTRACTION W/PHACO Right 07/20/2018   Procedure: CATARACT EXTRACTION PHACO AND INTRAOCULAR LENS PLACEMENT RIGHT EYE CDE=4.50;  Surgeon: Tonny Branch, MD;  Location: [facility];  Service: Ophthalmology;  Laterality: Right;  right  . CHOLECYSTECTOMY    . EXTRACORPOREAL SHOCK WAVE LITHOTRIPSY Right 08/22/2020   Procedure: EXTRACORPOREAL SHOCK WAVE LITHOTRIPSY (ESWL);  Surgeon: Cleon Gustin, MD;  Location: [facility];  Service: Urology;  Laterality: Right;    Home Medications:  Allergies as of 12/27/2020      Reactions   Penicillins Anaphylaxis   Has patient had a PCN reaction causing immediate rash, facial/tongue/throat swelling, SOB or lightheadedness with hypotension: yes Has patient had a PCN reaction causing severe rash involving mucus membranes or skin necrosis: no Has patient had a PCN reaction that required hospitalization: no Has patient had a PCN reaction occurring within  the last 10 years: no If all of the above answers are "NO", then may proceed with Cephalosporin use.   Latex Hives, Swelling      Medication List       Accurate as of December 27, 2020  4:04 PM. If you have any questions, ask your nurse or doctor.        amLODipine 10 MG tablet Commonly known as: NORVASC Take 5 mg by mouth at bedtime.   fexofenadine-pseudoephedrine 60-120 MG 12 hr tablet Commonly known as: ALLEGRA-D Take 1 tablet by mouth daily.   fluticasone 50 MCG/ACT nasal spray Commonly known as: FLONASE Place 1 spray into both nostrils daily as needed for allergies.   HYDROcodone-acetaminophen 10-325 MG tablet Commonly known as: NORCO Take 1 tablet by mouth every 4 (four) hours as needed for moderate pain.   Melatonin 10 MG Subl Take 10 mg by mouth at bedtime.   metaxalone 800 MG tablet Commonly known as: SKELAXIN Take 800 mg by mouth 4 (four) times daily as needed for muscle spasms.   sodium chloride 0.65 % Soln nasal spray Commonly known as: OCEAN Place 1 spray into both nostrils daily.   sulfamethoxazole-trimethoprim 800-160 MG tablet Commonly known as: BACTRIM DS Take 1 tablet by mouth every 12 (twelve) hours.   tamsulosin 0.4 MG Caps capsule Commonly known as: Flomax Take 1 capsule (0.4 mg total) by mouth daily after supper.       Allergies:  Allergies  Allergen Reactions  . Penicillins Anaphylaxis    Has patient had a PCN reaction causing immediate rash, facial/tongue/throat swelling,  SOB or lightheadedness with hypotension: yes Has patient had a PCN reaction causing severe rash involving mucus membranes or skin necrosis: no Has patient had a PCN reaction that required hospitalization: no Has patient had a PCN reaction occurring within the last 10 years: no If all of the above answers are "NO", then may proceed with Cephalosporin use.   . Latex Hives and Swelling    Family History: History reviewed. No pertinent family history.  Social History:   reports that she quit smoking about 10 years ago. Her smoking use included cigarettes. She has a 20.00 pack-year smoking history. She has never used smokeless tobacco. She reports previous alcohol use. She reports that she does not use drugs.  ROS: All other review of systems were reviewed and are negative except what is noted above in HPI  Physical Exam: BP (!) 161/100   Pulse 99   Temp 99 F (37.2 C)   Ht 5\' 5"  (1.651 m)   Wt 165 lb 9.6 oz (75.1 kg)   BMI 27.56 kg/m   Constitutional:  Alert and oriented, No acute distress. HEENT: [place] AT, moist mucus membranes.  Trachea midline, no masses. Cardiovascular: No clubbing, cyanosis, or edema. Respiratory: Normal respiratory effort, no increased work of breathing. GI: Abdomen is soft, nontender, nondistended, no abdominal masses GU: No CVA tenderness.  Lymph: No cervical or inguinal lymphadenopathy. Skin: No rashes, bruises or suspicious lesions. Neurologic: Grossly intact, no focal deficits, moving all 4 extremities. Psychiatric: Normal mood and affect.  Laboratory Data: Lab Results  Component Value Date   WBC 6.0 06/10/2018   HGB 12.9 06/10/2018   HCT 38.5 06/10/2018   MCV 95.1 06/10/2018   PLT 351 06/10/2018    Lab Results  Component Value Date   CREATININE 0.74 06/10/2018    No results found for: PSA  No results found for: TESTOSTERONE  No results found for: HGBA1C  Urinalysis    Component Value Date/Time   APPEARANCEUR Clear 09/28/2020 1603   GLUCOSEU Negative 09/28/2020 1603   BILIRUBINUR Negative 09/28/2020 1603   PROTEINUR Negative 09/28/2020 1603   UROBILINOGEN 0.2 04/12/2020 1420   NITRITE Negative 09/28/2020 1603   LEUKOCYTESUR Trace (A) 09/28/2020 1603    Lab Results  Component Value Date   LABMICR See below: 09/28/2020   WBCUA 0-5 09/28/2020   LABEPIT 0-10 09/28/2020   MUCUS Present (A) 09/28/2020   BACTERIA Few (A) 09/28/2020    Pertinent Imaging: KUB 12/27/2020: Images reviewed and discussed  with the patient  Results for orders placed during the hospital encounter of 09/27/20  Abdomen 1 view (KUB)  Narrative CLINICAL DATA:  Renal stone  EXAM: ABDOMEN - 1 VIEW  COMPARISON:  09/06/2020  FINDINGS: Nonobstructive pattern of bowel gas, with a generally large burden of stool throughout the colon and rectum, which limits assessment for renal calculi. Within this limitation, no obvious change in a single calculus projecting over the expected vicinity of the inferior pole of the right kidney, measuring approximately 7 mm in projection.  IMPRESSION: Nonobstructive pattern of bowel gas, with a generally large burden of stool throughout the colon and rectum, which limits assessment for renal calculi. Within this limitation, no obvious change in a single calculus projecting over the expected vicinity of the inferior pole of the right kidney, measuring approximately 7 mm in projection. CT is the test of choice for the evaluation of urinary tract calculi.   Electronically Signed By: Eddie Candle M.D. On: 09/27/2020 15:10  No results found for this  or any previous visit.  No results found for this or any previous visit.  No results found for this or any previous visit.  No results found for this or any previous visit.  No results found for this or any previous visit.  No results found for this or any previous visit.  Results for orders placed during the hospital encounter of 04/19/20  CT RENAL STONE STUDY  Narrative CLINICAL DATA:  Chronic right flank pain. Nephrolithiasis. Recurrent urinary tract infections.  EXAM: CT ABDOMEN AND PELVIS WITHOUT CONTRAST  TECHNIQUE: Multidetector CT imaging of the abdomen and pelvis was performed following the standard protocol without IV contrast.  COMPARISON:  None.  FINDINGS: Lower chest: No acute findings.  Hepatobiliary: No mass visualized on this unenhanced exam. Moderate diffuse hepatic steatosis is  demonstrated. Prior cholecystectomy. No evidence of biliary obstruction.  Pancreas: No mass or inflammatory process visualized on this unenhanced exam.  Spleen:  Within normal limits in size.  Adrenals/Urinary tract: Several tiny less than 5 mm renal calculi are seen bilaterally. Mild right hydronephrosis is seen, with a 13 mm calculus in the right renal pelvis. No evidence ureteral calculi or dilatation. A few small fluid attenuation cysts are noted in both kidneys. Unremarkable unopacified urinary bladder.  Stomach/Bowel: Moderate to large hiatal hernia is seen. No evidence of obstruction, inflammatory process, or abnormal fluid collections. Diverticulosis is seen mainly involving the sigmoid colon, however there is no evidence of diverticulitis.  Vascular/Lymphatic: No pathologically enlarged lymph nodes identified. No evidence of abdominal aortic aneurysm.  Reproductive: Prior hysterectomy noted. Adnexal regions are unremarkable in appearance.  Other:  None.  Musculoskeletal:  No suspicious bone lesions identified.  IMPRESSION: Mild right hydronephrosis, with 13 mm calculus in right renal pelvis.  Bilateral nephrolithiasis.  Moderate to large hiatal hernia.  Moderate hepatic steatosis.  Colonic diverticulosis. No radiographic evidence of diverticulitis.   Electronically Signed By: Marlaine Hind M.D. On: 04/19/2020 17:08   Assessment & Plan:    1. Nephrolithiasis RTC 3 months with KUB   Return in about 3 months (around 03/28/2021) for KUB.  Nicolette Bang, MD  [facility] [place]

## 2020-12-27 NOTE — Patient Instructions (Signed)

## 2021-03-30 ENCOUNTER — Ambulatory Visit: Payer: BC Managed Care – PPO | Admitting: Urology

## 2021-04-16 ENCOUNTER — Ambulatory Visit (HOSPITAL_COMMUNITY)
Admission: RE | Admit: 2021-04-16 | Discharge: 2021-04-16 | Disposition: A | Discharge status: Home / Self Care | Payer: BC Managed Care – PPO | Source: Ambulatory Visit | Attending: Urology | Admitting: Urology

## 2021-04-16 ENCOUNTER — Other Ambulatory Visit: Payer: Self-pay

## 2021-04-16 DIAGNOSIS — N200 Calculus of kidney: Secondary | ICD-10-CM | POA: Insufficient documentation

## 2021-04-17 ENCOUNTER — Telehealth (INDEPENDENT_AMBULATORY_CARE_PROVIDER_SITE_OTHER): Payer: BC Managed Care – PPO | Admitting: Urology

## 2021-04-17 DIAGNOSIS — N200 Calculus of kidney: Secondary | ICD-10-CM

## 2021-04-17 DIAGNOSIS — N390 Urinary tract infection, site not specified: Secondary | ICD-10-CM

## 2021-04-17 MED ORDER — NITROFURANTOIN MACROCRYSTAL 50 MG PO CAPS: 50.0000 mg | ORAL_CAPSULE | Freq: Every day | ORAL | 11 refills | Status: DC

## 2021-04-17 NOTE — Progress Notes (Signed)
04/17/2021 1:37 PM   Kristi Roman 12/31/1960 [id number]  Referring provider: [employer], Kristi Roman,  [place] [address]  Patient location: home Physician location: office I connected with  Kristi Roman on 04/17/21 by a video enabled telemedicine application and verified that I am speaking with the correct person using two identifiers.   I discussed the limitations of evaluation and management by telemedicine. The patient expressed understanding and agreed to proceed.     HPI: Ms Kristi Roman is a 60yo here for followup for nehrolithiasis and recurrent UTI. She passed small pieces of calculus within the past month. She denies any flank pain. KUB from yesterday shows a possible 33m right renal calculus.  She has had 3 UTIs in the past 6 months. She was diagnosed with a UTI 2 weeks ago and was treated by her PCP.   PMH: Past Medical History:  Diagnosis Date   Arthritis    Family history of adverse reaction to anesthesia    mother woke up during surgery   History of kidney stones    Hypertension    PONV (postoperative nausea and vomiting)     Surgical History: Past Surgical History:  Procedure Laterality Date   ABDOMINAL HYSTERECTOMY     APPENDECTOMY     BACK SURGERY     removal of cyst   CATARACT EXTRACTION W/PHACO Left 06/22/2018   Procedure: CATARACT EXTRACTION PHACO AND INTRAOCULAR LENS PLACEMENT ([place];  Surgeon: HTonny Branch MD;  Location: [facility];  Service: Ophthalmology;  Laterality: Left;  CDE: 6.03   CATARACT EXTRACTION W/PHACO Right 07/20/2018   Procedure: CATARACT EXTRACTION PHACO AND INTRAOCULAR LENS PLACEMENT RIGHT EYE CDE=4.50;  Surgeon: HTonny Branch MD;  Location: [facility];  Service: Ophthalmology;  Laterality: Right;  right   CHOLECYSTECTOMY     EXTRACORPOREAL SHOCK WAVE LITHOTRIPSY Right 08/22/2020   Procedure: EXTRACORPOREAL SHOCK WAVE LITHOTRIPSY (ESWL);  Surgeon: MCleon Gustin MD;  Location: [facility];  Service:  Urology;  Laterality: Right;    Home Medications:  Allergies as of 04/17/2021       Reactions   Penicillins Anaphylaxis   Has patient had a PCN reaction causing immediate rash, facial/tongue/throat swelling, SOB or lightheadedness with hypotension: yes Has patient had a PCN reaction causing severe rash involving mucus membranes or skin necrosis: no Has patient had a PCN reaction that required hospitalization: no Has patient had a PCN reaction occurring within the last 10 years: no If all of the above answers are "NO", then may proceed with Cephalosporin use.   Latex Hives, Swelling        Medication List        Accurate as of April 17, 2021  1:37 PM. If you have any questions, ask your nurse or doctor.          amLODipine 10 MG tablet Commonly known as: NORVASC Take 5 mg by mouth at bedtime.   fexofenadine-pseudoephedrine 60-120 MG 12 hr tablet Commonly known as: ALLEGRA-D Take 1 tablet by mouth daily.   fluticasone 50 MCG/ACT nasal spray Commonly known as: FLONASE Place 1 spray into both nostrils daily as needed for allergies.   HYDROcodone-acetaminophen 10-325 MG tablet Commonly known as: NORCO Take 1 tablet by mouth every 4 (four) hours as needed for moderate pain.   Melatonin 10 MG Subl Take 10 mg by mouth at bedtime.   metaxalone 800 MG tablet Commonly known as: SKELAXIN Take 800 mg by mouth 4 (four) times daily as needed for muscle spasms.  sodium chloride 0.65 % Soln nasal spray Commonly known as: OCEAN Place 1 spray into both nostrils daily.   sulfamethoxazole-trimethoprim 800-160 MG tablet Commonly known as: BACTRIM DS Take 1 tablet by mouth every 12 (twelve) hours.   tamsulosin 0.4 MG Caps capsule Commonly known as: Flomax Take 1 capsule (0.4 mg total) by mouth daily after supper.        Allergies:  Allergies  Allergen Reactions   Penicillins Anaphylaxis    Has patient had a PCN reaction causing immediate rash, facial/tongue/throat  swelling, SOB or lightheadedness with hypotension: yes Has patient had a PCN reaction causing severe rash involving mucus membranes or skin necrosis: no Has patient had a PCN reaction that required hospitalization: no Has patient had a PCN reaction occurring within the last 10 years: no If all of the above answers are "NO", then may proceed with Cephalosporin use.    Latex Hives and Swelling    Family History: No family history on file.  Social History:  reports that she quit smoking about 10 years ago. Her smoking use included cigarettes. She has a 20.00 pack-year smoking history. She has never used smokeless tobacco. She reports previous alcohol use. She reports that she does not use drugs.  ROS: All other review of systems were reviewed and are negative except what is noted above in HPI   Laboratory Data: Lab Results  Component Value Date   WBC 6.0 06/10/2018   HGB 12.9 06/10/2018   HCT 38.5 06/10/2018   MCV 95.1 06/10/2018   PLT 351 06/10/2018    Lab Results  Component Value Date   CREATININE 0.74 06/10/2018    No results found for: PSA  No results found for: TESTOSTERONE  No results found for: HGBA1C  Urinalysis    Component Value Date/Time   APPEARANCEUR Clear 12/27/2020 1606   GLUCOSEU Negative 12/27/2020 1606   BILIRUBINUR Negative 12/27/2020 1606   PROTEINUR Negative 12/27/2020 1606   UROBILINOGEN 0.2 04/12/2020 1420   NITRITE Negative 12/27/2020 1606   LEUKOCYTESUR Negative 12/27/2020 1606    Lab Results  Component Value Date   LABMICR Comment 12/27/2020   WBCUA 0-5 09/28/2020   LABEPIT 0-10 09/28/2020   MUCUS Present (A) 09/28/2020   BACTERIA Few (A) 09/28/2020    Pertinent Imaging: KUB yesterday: Images reviewed and discussed with the patient Results for orders placed during the hospital encounter of 12/26/20  Abdomen 1 view (KUB)  Narrative CLINICAL DATA:  Nephrolithiasis. History of right-sided lithotripsy.  EXAM: ABDOMEN - 1  VIEW  COMPARISON:  Most recent radiograph 09/27/2020. Most recent CT 04/19/2020  FINDINGS: 7 mm calcification again projects over the right renal bed. There may be an adjacent punctate calcification more inferiorly. The tiny left renal calculi on prior CT are not seen by radiograph. No evidence of ureteral stone. There is a moderate volume of colonic stool, which partially obscures detailed renal assessment. No obstruction. Cholecystectomy clips in the right upper quadrant. No acute osseous abnormalities are seen.  IMPRESSION: A 7 mm calcification again projects over the right renal bed. There may be an adjacent punctate calcification more inferiorly.  Tiny left renal calculi on CT are not seen by radiograph.   Electronically Signed By: Keith Rake M.D. On: 12/27/2020 16:17  No results found for this or any previous visit.  No results found for this or any previous visit.  No results found for this or any previous visit.  No results found for this or any previous visit.  No results  found for this or any previous visit.  No results found for this or any previous visit.  Results for orders placed during the hospital encounter of 04/19/20  CT RENAL STONE STUDY  Narrative CLINICAL DATA:  Chronic right flank pain. Nephrolithiasis. Recurrent urinary tract infections.  EXAM: CT ABDOMEN AND PELVIS WITHOUT CONTRAST  TECHNIQUE: Multidetector CT imaging of the abdomen and pelvis was performed following the standard protocol without IV contrast.  COMPARISON:  None.  FINDINGS: Lower chest: No acute findings.  Hepatobiliary: No mass visualized on this unenhanced exam. Moderate diffuse hepatic steatosis is demonstrated. Prior cholecystectomy. No evidence of biliary obstruction.  Pancreas: No mass or inflammatory process visualized on this unenhanced exam.  Spleen:  Within normal limits in size.  Adrenals/Urinary tract: Several tiny less than 5 mm renal  calculi are seen bilaterally. Mild right hydronephrosis is seen, with a 13 mm calculus in the right renal pelvis. No evidence ureteral calculi or dilatation. A few small fluid attenuation cysts are noted in both kidneys. Unremarkable unopacified urinary bladder.  Stomach/Bowel: Moderate to large hiatal hernia is seen. No evidence of obstruction, inflammatory process, or abnormal fluid collections. Diverticulosis is seen mainly involving the sigmoid colon, however there is no evidence of diverticulitis.  Vascular/Lymphatic: No pathologically enlarged lymph nodes identified. No evidence of abdominal aortic aneurysm.  Reproductive: Prior hysterectomy noted. Adnexal regions are unremarkable in appearance.  Other:  None.  Musculoskeletal:  No suspicious bone lesions identified.  IMPRESSION: Mild right hydronephrosis, with 13 mm calculus in right renal pelvis.  Bilateral nephrolithiasis.  Moderate to large hiatal hernia.  Moderate hepatic steatosis.  Colonic diverticulosis. No radiographic evidence of diverticulitis.   Electronically Signed By: Marlaine Hind M.D. On: 04/19/2020 17:08   Assessment & Plan:    1. Nephrolithiasis -RTC 6 months with KUB   No follow-ups on file.  Nicolette Bang, MD  [facility] [place]

## 2021-04-17 NOTE — Patient Instructions (Signed)
Textbook of Natural Medicine (5th ed., pp. 1518-1527.e3). [place], [place]: Elsevier.">  Dietary Guidelines to Help Prevent Kidney Stones Kidney stones are deposits of minerals and salts that form inside your kidneys. Your risk of developing kidney stones may be greater depending on your diet, your lifestyle, the medicines you take, and whether you have certain medical conditions. Most people can lower their chances of developing kidney stones by following the instructions below. Your dietitian may give you more specific instructions depending on your overall health and the type of kidney stones youtend to develop. What are tips for following this plan? Reading food labels  Choose foods with "no salt added" or "low-salt" labels. Limit your salt (sodium) intake to less than 1,500 mg a day. Choose foods with calcium for each meal and snack. Try to eat about 300 mg of calcium at each meal. Foods that contain 200-500 mg of calcium a serving include: 8 oz (237 mL) of milk, calcium-fortifiednon-dairy milk, and calcium-fortifiedfruit juice. Calcium-fortified means that calcium has been added to these drinks. 8 oz (237 mL) of kefir, yogurt, and soy yogurt. 4 oz (114 g) of tofu. 1 oz (28 g) of cheese. 1 cup (150 g) of dried figs. 1 cup (91 g) of cooked broccoli. One 3 oz (85 g) can of sardines or mackerel. Most people need 1,000-1,500 mg of calcium a day. Talk to your dietitian abouthow much calcium is recommended for you. Shopping Buy plenty of fresh fruits and vegetables. Most people do not need to avoid fruits and vegetables, even if these foods contain nutrients that may contribute to kidney stones. When shopping for convenience foods, choose: Whole pieces of fruit. Pre-made salads with dressing on the side. Low-fat fruit and yogurt smoothies. Avoid buying frozen meals or prepared deli foods. These can be high in sodium. Look for foods with live cultures, such as yogurt and kefir. Choose high-fiber  grains, such as whole-wheat breads, oat bran, and wheat cereals. Cooking Do not add salt to food when cooking. Place a salt shaker on the table and allow each person to add his or her own salt to taste. Use vegetable protein, such as beans, textured vegetable protein (TVP), or tofu, instead of meat in pasta, casseroles, and soups. Meal planning Eat less salt, if told by your dietitian. To do this: Avoid eating processed or pre-made food. Avoid eating fast food. Eat less animal protein, including cheese, meat, poultry, or fish, if told by your dietitian. To do this: Limit the number of times you have meat, poultry, fish, or cheese each week. Eat a diet free of meat at least 2 days a week. Eat only one serving each day of meat, poultry, fish, or seafood. When you prepare animal protein, cut pieces into small portion sizes. For most meat and fish, one serving is about the size of the palm of your hand. Eat at least five servings of fresh fruits and vegetables each day. To do this: Keep fruits and vegetables on hand for snacks. Eat one piece of fruit or a handful of berries with breakfast. Have a salad and fruit at lunch. Have two kinds of vegetables at dinner. Limit foods that are high in a substance called oxalate. These include: Spinach (cooked), rhubarb, beets, sweet potatoes, and Swiss chard. Peanuts. Potato chips, french fries, and baked potatoes with skin on. Nuts and nut products. Chocolate. If you regularly take a diuretic medicine, make sure to eat at least 1 or 2 servings of fruits or vegetables that are   high in potassium each day. These include: Avocado. Banana. Orange, prune, carrot, or tomato juice. Baked potato. Cabbage. Beans and split peas. Lifestyle  Drink enough fluid to keep your urine pale yellow. This is the most important thing you can do. Spread your fluid intake throughout the day. If you drink alcohol: Limit how much you use to: 0-1 drink a day for women who  are not pregnant. 0-2 drinks a day for men. Be aware of how much alcohol is in your drink. In the [place] one drink equals one 12 oz bottle of beer (355 mL), one 5 oz glass of wine (148 mL), or one 1 oz glass of hard liquor (44 mL). Lose weight if told by your health care provider. Work with your dietitian to find an eating plan and weight loss strategies that work best for you.  General information Talk to your health care provider and dietitian about taking daily supplements. You may be told the following depending on your health and the cause of your kidney stones: Not to take supplements with vitamin C. To take a calcium supplement. To take a daily probiotic supplement. To take other supplements such as magnesium, fish oil, or vitamin B6. Take over-the-counter and prescription medicines only as told by your health care provider. These include supplements. What foods should I limit? Limit your intake of the following foods, or eat them as told by your dietitian. Vegetables Spinach. Rhubarb. Beets. Canned vegetables. Pickles. Olives. Baked potatoeswith skin. Grains Wheat bran. Baked goods. Salted crackers. Cereals high in sugar. Meats and other proteins Nuts. Nut butters. Large portions of meat, poultry, or fish. Salted, precooked,or cured meats, such as sausages, meat loaves, and hot dogs. Dairy Cheese. Beverages Regular soft drinks. Regular vegetable juice. Seasonings and condiments Seasoning blends with salt. Salad dressings. Soy sauce. Ketchup. Barbecue sauce. Other foods Canned soups. Canned pasta sauce. Casseroles. Pizza. Lasagna. Frozen meals.Potato chips. French fries. The items listed above may not be a complete list of foods and beverages you should limit. Contact a dietitian for more information. What foods should I avoid? Talk to your dietitian about specific foods you should avoid based on the typeof kidney stones you have and your overall health. Fruits Grapefruit. The  item listed above may not be a complete list of foods and beverages you should avoid. Contact a dietitian for more information. Summary Kidney stones are deposits of minerals and salts that form inside your kidneys. You can lower your risk of kidney stones by making changes to your diet. The most important thing you can do is drink enough fluid. Drink enough fluid to keep your urine pale yellow. Talk to your dietitian about how much calcium you should have each day, and eat less salt and animal protein as told by your dietitian. This information is not intended to replace advice given to you by your health care provider. Make sure you discuss any questions you have with your healthcare provider. Document Revised: 09/02/2019 Document Reviewed: 09/02/2019 Elsevier Patient Education  2022 Elsevier Inc.  

## 2021-05-03 ENCOUNTER — Encounter: Payer: Self-pay | Admitting: Urology

## 2021-05-10 ENCOUNTER — Encounter: Payer: Self-pay | Admitting: Internal Medicine

## 2021-06-29 ENCOUNTER — Encounter: Payer: Self-pay | Admitting: Internal Medicine

## 2021-07-31 ENCOUNTER — Other Ambulatory Visit (HOSPITAL_COMMUNITY): Payer: Self-pay | Admitting: Family Medicine

## 2021-07-31 DIAGNOSIS — Z1231 Encounter for screening mammogram for malignant neoplasm of breast: Secondary | ICD-10-CM

## 2021-08-15 ENCOUNTER — Telehealth: Payer: Self-pay

## 2021-08-15 ENCOUNTER — Other Ambulatory Visit: Payer: Self-pay

## 2021-08-15 ENCOUNTER — Telehealth (INDEPENDENT_AMBULATORY_CARE_PROVIDER_SITE_OTHER): Payer: BC Managed Care – PPO | Admitting: Internal Medicine

## 2021-08-15 ENCOUNTER — Ambulatory Visit: Payer: BC Managed Care – PPO | Admitting: Gastroenterology

## 2021-08-15 ENCOUNTER — Encounter: Payer: Self-pay | Admitting: Internal Medicine

## 2021-08-15 ENCOUNTER — Telehealth: Payer: Self-pay | Admitting: *Deleted

## 2021-08-15 DIAGNOSIS — R1319 Other dysphagia: Secondary | ICD-10-CM | POA: Diagnosis not present

## 2021-08-15 DIAGNOSIS — K219 Gastro-esophageal reflux disease without esophagitis: Secondary | ICD-10-CM

## 2021-08-15 DIAGNOSIS — Z1211 Encounter for screening for malignant neoplasm of colon: Secondary | ICD-10-CM

## 2021-08-15 MED ORDER — PEG 3350-KCL-NA BICARB-NACL 420 G PO SOLR: 4000.0000 mL | ORAL | 0 refills | Status: AC

## 2021-08-15 NOTE — Telephone Encounter (Signed)
Called pt, TCS/EGD/DIL w/Propofol ASA 2 w/Dr. Abbey Chatters scheduled for 09/11/21 at 9:45am. Rx for prep sent to pharmacy. Orders entered. Instructions mailed.

## 2021-08-15 NOTE — Telephone Encounter (Signed)
Pt consented to a virtual visit. 

## 2021-08-15 NOTE — Telephone Encounter (Signed)
Hayden Pedro, you are scheduled for a virtual visit with your provider today.  Just as we do with appointments in the office, we must obtain your consent to participate.  Your consent will be active for this visit and any virtual visit you may have with one of our providers in the next 365 days.  If you have a MyChart account, I can also send a copy of this consent to you electronically.  All virtual visits are billed to your insurance company just like a traditional visit in the office.  As this is a virtual visit, video technology does not allow for your provider to perform a traditional examination.  This may limit your provider's ability to fully assess your condition.  If your provider identifies any concerns that need to be evaluated in person or the need to arrange testing such as labs, EKG, etc, we will make arrangements to do so.  Although advances in technology are sophisticated, we cannot ensure that it will always work on either your end or our end.  If the connection with a video visit is poor, we may have to switch to a telephone visit.  With either a video or telephone visit, we are not always able to ensure that we have a secure connection.   I need to obtain your verbal consent now.   Are you willing to proceed with your visit today?

## 2021-08-15 NOTE — H&P (View-Only) (Signed)
Referring Provider: Dr. Gerarda Fraction  Primary Care Physician:  Dr. Gerarda Fraction   Patient Location: Home   Provider Location: [facility] office   Reason for Visit: Dysphagia, Heartburn   Total time (minutes) spent on medical discussion: >21 minutes   Due to COVID-19, visit was conducted using virtual method.  Visit was requested by patient.  Virtual Visit via MyChart Video Note Due to COVID-19, visit is conducted virtually and was requested by patient.   I connected with Kristi Roman on 08/15/21 at  1:30 PM EST by telephone and verified that I am speaking with the correct person using two identifiers.   I discussed the limitations, risks, security and privacy concerns of performing an evaluation and management service by telephone and the availability of in person appointments. I also discussed with the patient that there may be a patient responsible charge related to this service. The patient expressed understanding and agreed to proceed.  Chief Complaint  Patient presents with   Dysphagia    Reports feels like food is hanging in esophagus, pressure in chest.     History of Present Illness: Patient presents for virtual visit by referral from her PCP Dr. Gerarda Fraction for evaluation.  She states for the last 3 to 4 months she has had progressively worsening dysphagia.  Feels as though food will get lodged in her substernal region.  Also causes pain as well.  Pain moderate to severe, does not radiate.  Does note multiple courses of recent antibiotics for UTIs.  No chronic NSAID use.  No melena hematochezia.  Does note intermittent reflux as well depending on what she eats.  Does not take any antiacid medications currently.  Also notes being due for a screening colonoscopy.  Last one greater than 10 years ago.  No family history of colorectal malignancy.  No unintentional weight loss.  Past Medical History:  Diagnosis Date   Arthritis    Family history of adverse reaction to anesthesia    mother  woke up during surgery   History of kidney stones    Hypertension    PONV (postoperative nausea and vomiting)      Past Surgical History:  Procedure Laterality Date   ABDOMINAL HYSTERECTOMY     APPENDECTOMY     BACK SURGERY     removal of cyst   CATARACT EXTRACTION W/PHACO Left 06/22/2018   Procedure: CATARACT EXTRACTION PHACO AND INTRAOCULAR LENS PLACEMENT ([place]);  Surgeon: Tonny Branch, MD;  Location: [facility];  Service: Ophthalmology;  Laterality: Left;  CDE: 6.03   CATARACT EXTRACTION W/PHACO Right 07/20/2018   Procedure: CATARACT EXTRACTION PHACO AND INTRAOCULAR LENS PLACEMENT RIGHT EYE CDE=4.50;  Surgeon: Tonny Branch, MD;  Location: [facility];  Service: Ophthalmology;  Laterality: Right;  right   CHOLECYSTECTOMY     EXTRACORPOREAL SHOCK WAVE LITHOTRIPSY Right 08/22/2020   Procedure: EXTRACORPOREAL SHOCK WAVE LITHOTRIPSY (ESWL);  Surgeon: Cleon Gustin, MD;  Location: [facility];  Service: Urology;  Laterality: Right;     Current Meds  Medication Sig   amLODipine (NORVASC) 10 MG tablet Take 10 mg by mouth at bedtime.   fexofenadine-pseudoephedrine (ALLEGRA-D) 60-120 MG 12 hr tablet Take 1 tablet by mouth daily.   fluticasone (FLONASE) 50 MCG/ACT nasal spray Place 1 spray into both nostrils daily as needed for allergies.    HYDROcodone-acetaminophen (NORCO) 10-325 MG tablet Take 1 tablet by mouth every 4 (four) hours as needed for moderate pain.    Melatonin 10 MG SUBL Take 10 mg by mouth at bedtime.  metaxalone (SKELAXIN) 800 MG tablet Take 800 mg by mouth 4 (four) times daily as needed for muscle spasms.    sodium chloride (OCEAN) 0.65 % SOLN nasal spray Place 1 spray into both nostrils daily.   tamsulosin (FLOMAX) 0.4 MG CAPS capsule Take 1 capsule (0.4 mg total) by mouth daily after supper.     History reviewed. No pertinent family history.  Social History   Socioeconomic History   Marital status: Married    Spouse name: Not on file   Number of children: 1   Years of  education: Not on file   Highest education level: Not on file  Occupational History   Not on file  Tobacco Use   Smoking status: Former    Packs/day: 1.00    Years: 20.00    Pack years: 20.00    Types: Cigarettes    Quit date: 06/10/2010    Years since quitting: 11.1   Smokeless tobacco: Never  Vaping Use   Vaping Use: Never used  Substance and Sexual Activity   Alcohol use: Not Currently   Drug use: Never   Sexual activity: Yes    Birth control/protection: Surgical  Other Topics Concern   Not on file  Social History Narrative   Not on file   Social Determinants of Health   Financial Resource Strain: Not on file  Food Insecurity: Not on file  Transportation Needs: Not on file  Physical Activity: Not on file  Stress: Not on file  Social Connections: Not on file       Review of Systems: Gen: Denies fever, chills, anorexia. Denies fatigue, weakness, weight loss.  CV: Denies chest pain, palpitations, syncope, peripheral edema, and claudication. Resp: Denies dyspnea at rest, cough, wheezing, coughing up blood, and pleurisy. GI: see HPI Derm: Denies rash, itching, dry skin Psych: Denies depression, anxiety, memory loss, confusion. No homicidal or suicidal ideation.  Heme: Denies bruising, bleeding, and enlarged lymph nodes.  Observations/Objective: No distress. Unable to perform physical exam due to telephone encounter. No video available.   Assessment and Plan: *Dysphagia *GERD *Colon cancer screening  Will schedule for EGD with possible dilation to evaluate for peptic ulcer disease, esophagitis, gastritis, H. Pylori, duodenitis, or other. Will also evaluate for esophageal stricture, Schatzki's ring, esophageal web or other.   At the same time, we will perform colonoscopy for colon cancer screening purposes.  The risks including infection, bleed, or perforation as well as benefits, limitations, alternatives and imponderables have been reviewed with the patient.  Potential for esophageal dilation, biopsy, etc. have also been reviewed.  Questions have been answered. All parties agreeable.  Further recommendations to follow.  Thank you Dr. Gerarda Fraction for the kind referral  Follow Up Instructions:    I discussed the assessment and treatment plan with the patient. The patient was provided an opportunity to ask questions and all were answered. The patient agreed with the plan and demonstrated an understanding of the instructions.   The patient was advised to call back or seek an in-person evaluation if the symptoms worsen or if the condition fails to improve as anticipated.

## 2021-08-15 NOTE — Progress Notes (Signed)
Referring Provider: Dr. Gerarda Fraction  Primary Care Physician:  Dr. Gerarda Fraction   Patient Location: Home   Provider Location: [facility] office   Reason for Visit: Dysphagia, Heartburn   Total time (minutes) spent on medical discussion: >21 minutes   Due to COVID-19, visit was conducted using virtual method.  Visit was requested by patient.  Virtual Visit via MyChart Video Note Due to COVID-19, visit is conducted virtually and was requested by patient.   I connected with Kristi Roman on 08/15/21 at  1:30 PM EST by telephone and verified that I am speaking with the correct person using two identifiers.   I discussed the limitations, risks, security and privacy concerns of performing an evaluation and management service by telephone and the availability of in person appointments. I also discussed with the patient that there may be a patient responsible charge related to this service. The patient expressed understanding and agreed to proceed.  Chief Complaint  Patient presents with   Dysphagia    Reports feels like food is hanging in esophagus, pressure in chest.     History of Present Illness: Patient presents for virtual visit by referral from her PCP Dr. Gerarda Fraction for evaluation.  She states for the last 3 to 4 months she has had progressively worsening dysphagia.  Feels as though food will get lodged in her substernal region.  Also causes pain as well.  Pain moderate to severe, does not radiate.  Does note multiple courses of recent antibiotics for UTIs.  No chronic NSAID use.  No melena hematochezia.  Does note intermittent reflux as well depending on what she eats.  Does not take any antiacid medications currently.  Also notes being due for a screening colonoscopy.  Last one greater than 10 years ago.  No family history of colorectal malignancy.  No unintentional weight loss.  Past Medical History:  Diagnosis Date   Arthritis    Family history of adverse reaction to anesthesia    mother  woke up during surgery   History of kidney stones    Hypertension    PONV (postoperative nausea and vomiting)      Past Surgical History:  Procedure Laterality Date   ABDOMINAL HYSTERECTOMY     APPENDECTOMY     BACK SURGERY     removal of cyst   CATARACT EXTRACTION W/PHACO Left 06/22/2018   Procedure: CATARACT EXTRACTION PHACO AND INTRAOCULAR LENS PLACEMENT ([facility]);  Surgeon: Tonny Branch, MD;  Location: [facility];  Service: Ophthalmology;  Laterality: Left;  CDE: 6.03   CATARACT EXTRACTION W/PHACO Right 07/20/2018   Procedure: CATARACT EXTRACTION PHACO AND INTRAOCULAR LENS PLACEMENT RIGHT EYE CDE=4.50;  Surgeon: Tonny Branch, MD;  Location: [facility];  Service: Ophthalmology;  Laterality: Right;  right   CHOLECYSTECTOMY     EXTRACORPOREAL SHOCK WAVE LITHOTRIPSY Right 08/22/2020   Procedure: EXTRACORPOREAL SHOCK WAVE LITHOTRIPSY (ESWL);  Surgeon: Cleon Gustin, MD;  Location: [facility];  Service: Urology;  Laterality: Right;     Current Meds  Medication Sig   amLODipine (NORVASC) 10 MG tablet Take 10 mg by mouth at bedtime.   fexofenadine-pseudoephedrine (ALLEGRA-D) 60-120 MG 12 hr tablet Take 1 tablet by mouth daily.   fluticasone (FLONASE) 50 MCG/ACT nasal spray Place 1 spray into both nostrils daily as needed for allergies.    HYDROcodone-acetaminophen (NORCO) 10-325 MG tablet Take 1 tablet by mouth every 4 (four) hours as needed for moderate pain.    Melatonin 10 MG SUBL Take 10 mg by mouth at bedtime.  metaxalone (SKELAXIN) 800 MG tablet Take 800 mg by mouth 4 (four) times daily as needed for muscle spasms.    sodium chloride (OCEAN) 0.65 % SOLN nasal spray Place 1 spray into both nostrils daily.   tamsulosin (FLOMAX) 0.4 MG CAPS capsule Take 1 capsule (0.4 mg total) by mouth daily after supper.     History reviewed. No pertinent family history.  Social History   Socioeconomic History   Marital status: Married    Spouse name: Not on file   Number of children: 1   Years of  education: Not on file   Highest education level: Not on file  Occupational History   Not on file  Tobacco Use   Smoking status: Former    Packs/day: 1.00    Years: 20.00    Pack years: 20.00    Types: Cigarettes    Quit date: 06/10/2010    Years since quitting: 11.1   Smokeless tobacco: Never  Vaping Use   Vaping Use: Never used  Substance and Sexual Activity   Alcohol use: Not Currently   Drug use: Never   Sexual activity: Yes    Birth control/protection: Surgical  Other Topics Concern   Not on file  Social History Narrative   Not on file   Social Determinants of Health   Financial Resource Strain: Not on file  Food Insecurity: Not on file  Transportation Needs: Not on file  Physical Activity: Not on file  Stress: Not on file  Social Connections: Not on file       Review of Systems: Gen: Denies fever, chills, anorexia. Denies fatigue, weakness, weight loss.  CV: Denies chest pain, palpitations, syncope, peripheral edema, and claudication. Resp: Denies dyspnea at rest, cough, wheezing, coughing up blood, and pleurisy. GI: see HPI Derm: Denies rash, itching, dry skin Psych: Denies depression, anxiety, memory loss, confusion. No homicidal or suicidal ideation.  Heme: Denies bruising, bleeding, and enlarged lymph nodes.  Observations/Objective: No distress. Unable to perform physical exam due to telephone encounter. No video available.   Assessment and Plan: *Dysphagia *GERD *Colon cancer screening  Will schedule for EGD with possible dilation to evaluate for peptic ulcer disease, esophagitis, gastritis, H. Pylori, duodenitis, or other. Will also evaluate for esophageal stricture, Schatzki's ring, esophageal web or other.   At the same time, we will perform colonoscopy for colon cancer screening purposes.  The risks including infection, bleed, or perforation as well as benefits, limitations, alternatives and imponderables have been reviewed with the patient.  Potential for esophageal dilation, biopsy, etc. have also been reviewed.  Questions have been answered. All parties agreeable.  Further recommendations to follow.  Thank you Dr. Gerarda Fraction for the kind referral  Follow Up Instructions:    I discussed the assessment and treatment plan with the patient. The patient was provided an opportunity to ask questions and all were answered. The patient agreed with the plan and demonstrated an understanding of the instructions.   The patient was advised to call back or seek an in-person evaluation if the symptoms worsen or if the condition fails to improve as anticipated.

## 2021-08-15 NOTE — Patient Instructions (Signed)
We will schedule you for upper endoscopy with possible esophageal dilation to further evaluate your upper GI issues.  At the same time we will perform colonoscopy for colon cancer screening purposes.  Further recommendations to follow.  It was very nice meeting you today.  Dr. Abbey Chatters  At [facility] we value your feedback. You may receive a survey about your visit today. Please share your experience as we strive to create trusting relationships with our patients to provide genuine, compassionate, quality care.  We appreciate your understanding and patience as we review any laboratory studies, imaging, and other diagnostic tests that are ordered as we care for you. Our office policy is 5 business days for review of these results, and any emergent or urgent results are addressed in a timely manner for your best interest. If you do not hear from our office in 1 week, please contact us.   We also encourage the use of MyChart, which contains your medical information for your review as well. If you are not enrolled in this feature, an access code is on this after visit summary for your convenience. Thank you for allowing [place] to be involved in your care.  It was great to see you today!  I hope you have a great rest of your Fall!    Elon Alas. Abbey Chatters, D.O. Gastroenterology and Hepatology [facility]

## 2021-09-11 ENCOUNTER — Encounter (HOSPITAL_COMMUNITY): Payer: Self-pay

## 2021-09-11 ENCOUNTER — Ambulatory Visit (HOSPITAL_COMMUNITY): Payer: BC Managed Care – PPO | Admitting: Anesthesiology

## 2021-09-11 ENCOUNTER — Encounter (HOSPITAL_COMMUNITY)
Admission: RE | Disposition: A | Discharge status: Home / Self Care | Payer: Self-pay | Source: Home / Self Care | Attending: Internal Medicine

## 2021-09-11 ENCOUNTER — Ambulatory Visit (HOSPITAL_COMMUNITY)
Admission: RE | Admit: 2021-09-11 | Discharge: 2021-09-11 | Disposition: A | Discharge status: Home / Self Care | Payer: BC Managed Care – PPO | Attending: Internal Medicine | Admitting: Internal Medicine

## 2021-09-11 ENCOUNTER — Other Ambulatory Visit: Payer: Self-pay

## 2021-09-11 DIAGNOSIS — K2970 Gastritis, unspecified, without bleeding: Secondary | ICD-10-CM | POA: Diagnosis not present

## 2021-09-11 DIAGNOSIS — K5730 Diverticulosis of large intestine without perforation or abscess without bleeding: Secondary | ICD-10-CM | POA: Insufficient documentation

## 2021-09-11 DIAGNOSIS — Z1211 Encounter for screening for malignant neoplasm of colon: Secondary | ICD-10-CM

## 2021-09-11 DIAGNOSIS — R12 Heartburn: Secondary | ICD-10-CM | POA: Insufficient documentation

## 2021-09-11 DIAGNOSIS — K648 Other hemorrhoids: Secondary | ICD-10-CM | POA: Diagnosis not present

## 2021-09-11 DIAGNOSIS — D125 Benign neoplasm of sigmoid colon: Secondary | ICD-10-CM

## 2021-09-11 DIAGNOSIS — D120 Benign neoplasm of cecum: Secondary | ICD-10-CM | POA: Insufficient documentation

## 2021-09-11 DIAGNOSIS — K449 Diaphragmatic hernia without obstruction or gangrene: Secondary | ICD-10-CM | POA: Insufficient documentation

## 2021-09-11 DIAGNOSIS — R1310 Dysphagia, unspecified: Secondary | ICD-10-CM | POA: Insufficient documentation

## 2021-09-11 DIAGNOSIS — K319 Disease of stomach and duodenum, unspecified: Secondary | ICD-10-CM | POA: Diagnosis not present

## 2021-09-11 HISTORY — DX: Chronic or unspecified gastric ulcer with hemorrhage: K25.4

## 2021-09-11 HISTORY — PX: ESOPHAGOGASTRODUODENOSCOPY (EGD) WITH PROPOFOL: SHX5813

## 2021-09-11 HISTORY — PX: COLONOSCOPY WITH PROPOFOL: SHX5780

## 2021-09-11 HISTORY — PX: BIOPSY: SHX5522

## 2021-09-11 HISTORY — PX: POLYPECTOMY: SHX5525

## 2021-09-11 SURGERY — COLONOSCOPY WITH PROPOFOL
Anesthesia: General

## 2021-09-11 MED ORDER — PROPOFOL 500 MG/50ML IV EMUL
INTRAVENOUS | Status: DC | PRN
  Administered 2021-09-11: 150 ug/kg/min via INTRAVENOUS

## 2021-09-11 MED ORDER — PROPOFOL 10 MG/ML IV BOLUS
INTRAVENOUS | Status: DC | PRN
  Administered 2021-09-11: 100 mg via INTRAVENOUS
  Administered 2021-09-11: 50 mg via INTRAVENOUS

## 2021-09-11 MED ORDER — LIDOCAINE HCL (CARDIAC) PF 100 MG/5ML IV SOSY
PREFILLED_SYRINGE | INTRAVENOUS | Status: DC | PRN
Start: 2021-09-11 — End: 2021-09-11
  Administered 2021-09-11: 50 mg via INTRAVENOUS

## 2021-09-11 MED ORDER — PANTOPRAZOLE SODIUM 40 MG PO TBEC: 40.0000 mg | DELAYED_RELEASE_TABLET | Freq: Two times a day (BID) | ORAL | 5 refills | Status: DC

## 2021-09-11 MED ORDER — LACTATED RINGERS IV SOLN: INTRAVENOUS | Status: DC

## 2021-09-11 NOTE — Anesthesia Preprocedure Evaluation (Addendum)
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 on medications Normal cardiovascular exam Rhythm:Regular Rate:Normal     Neuro/Psych negative neurological ROS  negative psych ROS   GI/Hepatic Neg liver ROS, PUD,   Endo/Other  negative endocrine ROS  Renal/GU Renal disease (stones)  negative genitourinary   Musculoskeletal  (+) Arthritis , Osteoarthritis,    Abdominal   Peds negative pediatric ROS (+)  Hematology negative hematology ROS (+)   Anesthesia Other Findings Neck pain  Reproductive/Obstetrics negative OB ROS                            Anesthesia Physical Anesthesia Plan  ASA: 2  Anesthesia Plan: General   Post-op Pain Management: Minimal or no pain anticipated   Induction:   PONV Risk Score and Plan: TIVA  Airway Management Planned: Nasal Cannula and Natural Airway  Additional Equipment:   Intra-op Plan:   Post-operative Plan:   Informed Consent: I have reviewed the patients History and Physical, chart, labs and discussed the procedure including the risks, benefits and alternatives for the proposed anesthesia with the patient or authorized representative who has indicated his/her understanding and acceptance.     Dental advisory  given  Plan Discussed with: CRNA and Surgeon  Anesthesia Plan Comments:         Anesthesia Quick Evaluation

## 2021-09-11 NOTE — Interval H&P Note (Signed)
History and Physical Interval Note:  09/11/2021 8:56 AM  Kristi Roman  has presented today for surgery, with the diagnosis of screening colonoscopy, GERD, dysphagia.  The various methods of treatment have been discussed with the patient and family. After consideration of risks, benefits and other options for treatment, the patient has consented to  Procedure(s) with comments: COLONOSCOPY WITH PROPOFOL (N/A) - 9:45am ESOPHAGOGASTRODUODENOSCOPY (EGD) WITH PROPOFOL (N/A) BALLOON DILATION (N/A) as a surgical intervention.  The patient's history has been reviewed, patient examined, no change in status, stable for surgery.  I have reviewed the patient's chart and labs.  Questions were answered to the patient's satisfaction.     Eloise Harman

## 2021-09-11 NOTE — Op Note (Signed)
[facility] Patient Name: Kristi Roman Procedure Date: 09/11/2021 9:40 AM MRN: [id number] Date of Birth: 1961/07/02 Attending MD: Elon Alas. Abbey Chatters DO CSN: [id number] Age: 60 Admit Type: Outpatient Procedure:                Colonoscopy Indications:              Screening for colorectal malignant neoplasm Providers:                Elon Alas. Abbey Chatters, DO, Janeece Riggers, RN, Aram Candela Referring MD:              Medicines:                See the Anesthesia note for documentation of the                            administered medications Complications:            No immediate complications. Estimated Blood Loss:     Estimated blood loss was minimal. Procedure:                Pre-Anesthesia Assessment:                           - The anesthesia plan was to use monitored                            anesthesia care (MAC).                           After obtaining informed consent, the colonoscope                            was passed under direct vision. Throughout the                            procedure, the patient's blood pressure, pulse, and                            oxygen saturations were monitored continuously. The                            [id number]-[id number] ([id number]) was introduced through the                            anus and advanced to the the cecum, identified by                            appendiceal orifice and ileocecal valve. The                            colonoscopy was technically difficult and complex                            due to restricted mobility of the colon. The  patient tolerated the procedure well. The quality                            of the bowel preparation was evaluated using the                            BBPS [facility] Bowel Preparation Scale) with scores                            of: Right Colon = 3, Transverse Colon = 3 and Left                            Colon = 3 (entire mucosa seen well with no residual                             staining, small fragments of stool or opaque                            liquid). The total BBPS score equals 9. Scope In: 9:43:10 AM Scope Out: 60:10:93 AM Scope Withdrawal Time: 0 hours 10 minutes 42 seconds  Total Procedure Duration: 0 hours 23 minutes 36 seconds  Findings:      The perianal and digital rectal examinations were normal.      Non-bleeding internal hemorrhoids were found during endoscopy.      Many small and large-mouthed diverticula were found in the sigmoid colon.      A 6 mm polyp was found in the cecum. The polyp was flat. The polyp was       removed with a cold snare. Resection and retrieval were complete.      Two sessile polyps were found in the sigmoid colon. The polyps were 4 to       5 mm in size. These polyps were removed with a cold snare. Resection and       retrieval were complete.      The exam was otherwise without abnormality. Impression:               - Non-bleeding internal hemorrhoids.                           - Diverticulosis in the sigmoid colon.                           - One 6 mm polyp in the cecum, removed with a cold                            snare. Resected and retrieved.                           - Two 4 to 5 mm polyps in the sigmoid colon,                            removed with a cold snare. Resected and retrieved.                           -  The examination was otherwise normal. Moderate Sedation:      Per Anesthesia Care Recommendation:           - Patient has a contact number available for                            emergencies. The signs and symptoms of potential                            delayed complications were discussed with the                            patient. Return to normal activities tomorrow.                            Written discharge instructions were provided to the                            patient.                           - Resume previous diet.                           - Continue present  medications.                           - Await pathology results.                           - Repeat colonoscopy in 5 years for surveillance.                           - Return to GI clinic in 4 months. Procedure Code(s):        --- Professional ---                           [phone], Colonoscopy, flexible; with removal of                            tumor(s), polyp(s), or other lesion(s) by snare                            technique Diagnosis Code(s):        --- Professional ---                           K63.5, Polyp of colon                           Z12.11, Encounter for screening for malignant                            neoplasm of colon                           K64.8, Other hemorrhoids  K57.30, Diverticulosis of large intestine without                            perforation or abscess without bleeding CPT copyright 2019 American Medical Association. All rights reserved. The codes documented in this report are preliminary and upon coder review may  be revised to meet current compliance requirements. Elon Alas. Abbey Chatters, DO [name], DO 09/11/2021 10:11:33 AM This report has been signed electronically. Number of Addenda: 0

## 2021-09-11 NOTE — Anesthesia Postprocedure Evaluation (Signed)
Anesthesia Post Note  Patient: Kristi Roman  Procedure(s) Performed: COLONOSCOPY WITH PROPOFOL ESOPHAGOGASTRODUODENOSCOPY (EGD) WITH PROPOFOL BIOPSY POLYPECTOMY  Patient location during evaluation: Endoscopy Anesthesia Type: General Level of consciousness: awake and alert and oriented Pain management: pain level controlled Vital Signs Assessment: post-procedure vital signs reviewed and stable Respiratory status: spontaneous breathing, nonlabored ventilation and respiratory function stable Cardiovascular status: blood pressure returned to baseline and stable Postop Assessment: no apparent nausea or vomiting Anesthetic complications: no   No notable events documented.   Last Vitals:  Vitals:   09/11/21 0826 09/11/21 1009  BP: (!) 161/96 109/66  Pulse: (!) 108   Resp: 14 (!) 22  Temp: 36.9 C 36.4 C  SpO2: 97% 96%    Last Pain:  Vitals:   09/11/21 1009  TempSrc: Oral  PainSc: 0-No pain                 Marykatherine Sherwood C Berdene Askari

## 2021-09-11 NOTE — Transfer of Care (Signed)
Immediate Anesthesia Transfer of Care Note  Patient: Kristi Roman  Procedure(s) Performed: COLONOSCOPY WITH PROPOFOL ESOPHAGOGASTRODUODENOSCOPY (EGD) WITH PROPOFOL BIOPSY POLYPECTOMY  Patient Location: Endoscopy Unit  Anesthesia Type:General  Level of Consciousness: drowsy  Airway & Oxygen Therapy: Patient Spontanous Breathing  Post-op Assessment: Report given to RN and Post -op Vital signs reviewed and stable  Post vital signs: Reviewed and stable  Last Vitals:  Vitals Value Taken Time  BP    Temp    Pulse    Resp    SpO2      Last Pain:  Vitals:   09/11/21 0934  TempSrc:   PainSc: 4       Patients Stated Pain Goal: 5 (62/26/33 3545)  Complications: No notable events documented.

## 2021-09-11 NOTE — Discharge Instructions (Addendum)
EGD Discharge instructions Please read the instructions outlined below and refer to this sheet in the next few weeks. These discharge instructions provide you with general information on caring for yourself after you leave the hospital. Your doctor may also give you specific instructions. While your treatment has been planned according to the most current medical practices available, unavoidable complications occasionally occur. If you have any problems or questions after discharge, please call your doctor. ACTIVITY You may resume your regular activity but move at a slower pace for the next 24 hours.  Take frequent rest periods for the next 24 hours.  Walking will help expel (get rid of) the air and reduce the bloated feeling in your abdomen.  No driving for 24 hours (because of the anesthesia (medicine) used during the test).  You may shower.  Do not sign any important legal documents or operate any machinery for 24 hours (because of the anesthesia used during the test).  NUTRITION Drink plenty of fluids.  You may resume your normal diet.  Begin with a light meal and progress to your normal diet.  Avoid alcoholic beverages for 24 hours or as instructed by your caregiver.  MEDICATIONS You may resume your normal medications unless your caregiver tells you otherwise.  WHAT YOU CAN EXPECT TODAY You may experience abdominal discomfort such as a feeling of fullness or gas pains.  FOLLOW-UP Your doctor will discuss the results of your test with you.  SEEK IMMEDIATE MEDICAL ATTENTION IF ANY OF THE FOLLOWING OCCUR: Excessive nausea (feeling sick to your stomach) and/or vomiting.  Severe abdominal pain and distention (swelling).  Trouble swallowing.  Temperature over 101 F (37.8 C).  Rectal bleeding or vomiting of blood.     Colonoscopy Discharge Instructions  Read the instructions outlined below and refer to this sheet in the next few weeks. These discharge instructions provide you with  general information on caring for yourself after you leave the hospital. Your doctor may also give you specific instructions. While your treatment has been planned according to the most current medical practices available, unavoidable complications occasionally occur.   ACTIVITY You may resume your regular activity, but move at a slower pace for the next 24 hours.  Take frequent rest periods for the next 24 hours.  Walking will help get rid of the air and reduce the bloated feeling in your belly (abdomen).  No driving for 24 hours (because of the medicine (anesthesia) used during the test).   Do not sign any important legal documents or operate any machinery for 24 hours (because of the anesthesia used during the test).  NUTRITION Drink plenty of fluids.  You may resume your normal diet as instructed by your doctor.  Begin with a light meal and progress to your normal diet. Heavy or fried foods are harder to digest and may make you feel sick to your stomach (nauseated).  Avoid alcoholic beverages for 24 hours or as instructed.  MEDICATIONS You may resume your normal medications unless your doctor tells you otherwise.  WHAT YOU CAN EXPECT TODAY Some feelings of bloating in the abdomen.  Passage of more gas than usual.  Spotting of blood in your stool or on the toilet paper.  IF YOU HAD POLYPS REMOVED DURING THE COLONOSCOPY: No aspirin products for 7 days or as instructed.  No alcohol for 7 days or as instructed.  Eat a soft diet for the next 24 hours.  FINDING OUT THE RESULTS OF YOUR TEST Not all test results are  available during your visit. If your test results are not back during the visit, make an appointment with your caregiver to find out the results. Do not assume everything is normal if you have not heard from your caregiver or the medical facility. It is important for you to follow up on all of your test results.  SEEK IMMEDIATE MEDICAL ATTENTION IF: You have more than a spotting of  blood in your stool.  Your belly is swollen (abdominal distention).  You are nauseated or vomiting.  You have a temperature over 101.  You have abdominal pain or discomfort that is severe or gets worse throughout the day.   Your EGD revealed mild amount inflammation in your stomach.  I took biopsies of this to rule out infection with a bacteria called H. pylori.  Await pathology results, my office will contact you.  You have a medium to large size hiatal hernia as well which is likely causing your difficulty swallowing.  This is led to a few ulcerations called Lysbeth Galas lesions.  I am going to start you on a new medication called pantoprazole 40 mg.  I want you to take this twice daily for 12 weeks at which point you can go down to once daily.  Hopefully this improves your symptoms.  If not, we can consider surgical referral for hiatal hernia repair.  Your colonoscopy revealed 3 polyp(s) which I removed successfully. Await pathology results, my office will contact you. I recommend repeating colonoscopy in 5 years for surveillance purposes. You also have diverticulosis and internal hemorrhoids. I would recommend increasing fiber in your diet or adding OTC Benefiber/Metamucil. Be sure to drink at least 4 to 6 glasses of water daily.   Follow-up with GI in 4 months    I hope you have a great rest of your week!  Elon Alas. Abbey Chatters, D.O. Gastroenterology and Hepatology [facility]

## 2021-09-11 NOTE — Op Note (Signed)
[facility] Patient Name: Kristi Roman Procedure Date: 09/11/2021 9:25 AM MRN: [id number] Date of Birth: Jul 11, 1961 Attending MD: Elon Alas. Abbey Chatters DO CSN: [id number] Age: 60 Admit Type: Inpatient Procedure:                Upper GI endoscopy Indications:              Dysphagia, Heartburn Providers:                Elon Alas. Abbey Chatters, DO, Janeece Riggers, RN, Aram Candela Referring MD:              Medicines:                See the Anesthesia note for documentation of the                            administered medications Complications:            No immediate complications. Estimated Blood Loss:     Estimated blood loss was minimal. Procedure:                Pre-Anesthesia Assessment:                           - The anesthesia plan was to use monitored                            anesthesia care (MAC).                           After obtaining informed consent, the endoscope was                            passed under direct vision. Throughout the                            procedure, the patient's blood pressure, pulse, and                            oxygen saturations were monitored continuously. The                            [id number]-H190 ([id number]) scope was introduced through the                            mouth, and advanced to the second part of duodenum.                            The upper GI endoscopy was accomplished without                            difficulty. The patient tolerated the procedure                            well. Scope In: 9:34:56 AM Scope Out: 9:39:01 AM Total Procedure Duration: 0 hours 4 minutes 5 seconds  Findings:      The Z-line was regular and was found 34 cm  from the incisors.      A medium-sized hiatal hernia was present. 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      Diffuse mild inflammation characterized by erythema was found in the       entire examined stomach. Biopsies were taken with a cold forceps for       Helicobacter pylori  testing.      The duodenal bulb, first portion of the duodenum and second portion of       the duodenum were normal. Impression:               - Z-line regular, 34 cm from the incisors.                           - Medium-sized hiatal hernia.                           - Gastritis. Biopsied.                           - Normal duodenal bulb, first portion of the                            duodenum and second portion of the duodenum. Moderate Sedation:      Per Anesthesia Care Recommendation:           - Patient has a contact number available for                            emergencies. The signs and symptoms of potential                            delayed complications were discussed with the                            patient. Return to normal activities tomorrow.                            Written discharge instructions were provided to the                            patient.                           - Resume previous diet.                           - Continue present medications.                           - Await pathology results.                           - Use Protonix (pantoprazole) 40 mg PO BID for 12                            weeks.                           -  Consider surgical referral to discuss hiatal                            hernia repair if symptoms not improved on PPI                            therapy. Procedure Code(s):        --- Professional ---                           [phone], Esophagogastroduodenoscopy, flexible,                            transoral; with biopsy, single or multiple Diagnosis Code(s):        --- Professional ---                           K44.9, Diaphragmatic hernia without obstruction or                            gangrene                           K29.70, Gastritis, unspecified, without bleeding                           R13.10, Dysphagia, unspecified                           R12, Heartburn CPT copyright 2019 American [employer] Association. All  rights reserved. The codes documented in this report are preliminary and upon coder review may  be revised to meet current compliance requirements. Elon Alas. Abbey Chatters, DO [place] Abbey Chatters, DO 09/11/2021 10:09:15 AM This report has been signed electronically. Number of Addenda: 0

## 2021-09-12 LAB — SURGICAL PATHOLOGY

## 2021-09-13 ENCOUNTER — Encounter (HOSPITAL_COMMUNITY): Payer: Self-pay | Admitting: Internal Medicine

## 2021-10-22 ENCOUNTER — Ambulatory Visit: Payer: BC Managed Care – PPO | Admitting: Urology

## 2021-10-24 ENCOUNTER — Encounter: Payer: Self-pay | Admitting: Urology

## 2021-10-24 ENCOUNTER — Other Ambulatory Visit: Payer: Self-pay

## 2021-10-24 ENCOUNTER — Ambulatory Visit: Payer: BC Managed Care – PPO | Admitting: Urology

## 2021-10-24 ENCOUNTER — Ambulatory Visit (HOSPITAL_COMMUNITY)
Admission: RE | Admit: 2021-10-24 | Discharge: 2021-10-24 | Disposition: A | Discharge status: Home / Self Care | Payer: BC Managed Care – PPO | Source: Ambulatory Visit | Attending: Urology | Admitting: Urology

## 2021-10-24 VITALS — BP 168/82 | HR 111

## 2021-10-24 DIAGNOSIS — N200 Calculus of kidney: Secondary | ICD-10-CM | POA: Diagnosis not present

## 2021-10-24 DIAGNOSIS — N390 Urinary tract infection, site not specified: Secondary | ICD-10-CM

## 2021-10-24 NOTE — Patient Instructions (Signed)
Dietary Guidelines to Help Prevent Kidney Stones Kidney stones are deposits of minerals and salts that form inside your kidneys. Your risk of developing kidney stones may be greater depending on your diet, your lifestyle, the medicines you take, and whether you have certain medical conditions. Most people can lower their chances of developing kidney stones by following the instructions below. Your dietitian may give you more specific instructions depending on your overall health and the type of kidney stones you tend to develop. What are tips for following this plan? Reading food labels  Choose foods with "no salt added" or "low-salt" labels. Limit your salt (sodium) intake to less than 1,500 mg a day. Choose foods with calcium for each meal and snack. Try to eat about 300 mg of calcium at each meal. Foods that contain 200-500 mg of calcium a serving include: 8 oz (237 mL) of milk, calcium-fortifiednon-dairy milk, and calcium-fortifiedfruit juice. Calcium-fortified means that calcium has been added to these drinks. 8 oz (237 mL) of kefir, yogurt, and soy yogurt. 4 oz (114 g) of tofu. 1 oz (28 g) of cheese. 1 cup (150 g) of dried figs. 1 cup (91 g) of cooked broccoli. One 3 oz (85 g) can of sardines or mackerel. Most people need 1,000-1,500 mg of calcium a day. Talk to your dietitian about how much calcium is recommended for you. Shopping Buy plenty of fresh fruits and vegetables. Most people do not need to avoid fruits and vegetables, even if these foods contain nutrients that may contribute to kidney stones. When shopping for convenience foods, choose: Whole pieces of fruit. Pre-made salads with dressing on the side. Low-fat fruit and yogurt smoothies. Avoid buying frozen meals or prepared deli foods. These can be high in sodium. Look for foods with live cultures, such as yogurt and kefir. Choose high-fiber grains, such as whole-wheat breads, oat bran, and wheat cereals. Cooking Do not add  salt to food when cooking. Place a salt shaker on the table and allow each person to add his or her own salt to taste. Use vegetable protein, such as beans, textured vegetable protein (TVP), or tofu, instead of meat in pasta, casseroles, and soups. Meal planning Eat less salt, if told by your dietitian. To do this: Avoid eating processed or pre-made food. Avoid eating fast food. Eat less animal protein, including cheese, meat, poultry, or fish, if told by your dietitian. To do this: Limit the number of times you have meat, poultry, fish, or cheese each week. Eat a diet free of meat at least 2 days a week. Eat only one serving each day of meat, poultry, fish, or seafood. When you prepare animal protein, cut pieces into small portion sizes. For most meat and fish, one serving is about the size of the palm of your hand. Eat at least five servings of fresh fruits and vegetables each day. To do this: Keep fruits and vegetables on hand for snacks. Eat one piece of fruit or a handful of berries with breakfast. Have a salad and fruit at lunch. Have two kinds of vegetables at dinner. Limit foods that are high in a substance called oxalate. These include: Spinach (cooked), rhubarb, beets, sweet potatoes, and Swiss chard. Peanuts. Potato chips, french fries, and baked potatoes with skin on. Nuts and nut products. Chocolate. If you regularly take a diuretic medicine, make sure to eat at least 1 or 2 servings of fruits or vegetables that are high in potassium each day. These include: Avocado. Banana. Orange, prune,   carrot, or tomato juice. Baked potato. Cabbage. Beans and split peas. Lifestyle  Drink enough fluid to keep your urine pale yellow. This is the most important thing you can do. Spread your fluid intake throughout the day. If you drink alcohol: Limit how much you use to: 0-1 drink a day for women who are not pregnant. 0-2 drinks a day for men. Be aware of how much alcohol is in your  drink. In the [place] one drink equals one 12 oz bottle of beer (355 mL), one 5 oz glass of wine (148 mL), or one 1 oz glass of hard liquor (44 mL). Lose weight if told by your health care provider. Work with your dietitian to find an eating plan and weight loss strategies that work best for you. General information Talk to your health care provider and dietitian about taking daily supplements. You may be told the following depending on your health and the cause of your kidney stones: Not to take supplements with vitamin C. To take a calcium supplement. To take a daily probiotic supplement. To take other supplements such as magnesium, fish oil, or vitamin B6. Take over-the-counter and prescription medicines only as told by your health care provider. These include supplements. What foods should I limit? Limit your intake of the following foods, or eat them as told by your dietitian. Vegetables Spinach. Rhubarb. Beets. Canned vegetables. Pickles. Olives. Baked potatoes with skin. Grains Wheat bran. Baked goods. Salted crackers. Cereals high in sugar. Meats and other proteins Nuts. Nut butters. Large portions of meat, poultry, or fish. Salted, precooked, or cured meats, such as sausages, meat loaves, and hot dogs. Dairy Cheese. Beverages Regular soft drinks. Regular vegetable juice. Seasonings and condiments Seasoning blends with salt. Salad dressings. Soy sauce. Ketchup. Barbecue sauce. Other foods Canned soups. Canned pasta sauce. Casseroles. Pizza. Lasagna. Frozen meals. Potato chips. French fries. The items listed above may not be a complete list of foods and beverages you should limit. Contact a dietitian for more information. What foods should I avoid? Talk to your dietitian about specific foods you should avoid based on the type of kidney stones you have and your overall health. Fruits Grapefruit. The item listed above may not be a complete list of foods and beverages you should  avoid. Contact a dietitian for more information. Summary Kidney stones are deposits of minerals and salts that form inside your kidneys. You can lower your risk of kidney stones by making changes to your diet. The most important thing you can do is drink enough fluid. Drink enough fluid to keep your urine pale yellow. Talk to your dietitian about how much calcium you should have each day, and eat less salt and animal protein as told by your dietitian. This information is not intended to replace advice given to you by your health care provider. Make sure you discuss any questions you have with your health care provider. Document Revised: 09/02/2019 Document Reviewed: 09/02/2019 Elsevier Patient Education  2022 Elsevier Inc.  

## 2021-10-24 NOTE — Progress Notes (Signed)
Urological Symptom Review  Patient is experiencing the following symptoms: Get up at night to urinate Stream starts and stops   Review of Systems  Gastrointestinal (upper)  : Negative for upper GI symptoms  Gastrointestinal (lower) : Negative for lower GI symptoms  Constitutional : Negative for symptoms  Skin: Negative for skin symptoms  Eyes: Negative for eye symptoms  Ear/Nose/Throat : Negative for Ear/Nose/Throat symptoms  Hematologic/Lymphatic: Negative for Hematologic/Lymphatic symptoms  Cardiovascular : Negative for cardiovascular symptoms  Respiratory : Negative for respiratory symptoms  Endocrine: Negative for endocrine symptoms  Musculoskeletal: Negative for musculoskeletal symptoms  Neurological: Negative for neurological symptoms  Psychologic: Negative for psychiatric symptoms 

## 2021-10-24 NOTE — Progress Notes (Signed)
10/24/2021 4:20 PM   Kristi Roman March 24, 1961 [id number]  Referring provider: Redmond School, MD [address] [place],  [place] [address]  Followup nephrolithiasis   HPI: Kristi Roman is a 61yo here for followup for nephrolithiasis. She denies any stone events since last visit. She denies any flank pain. She denies any significant LUTS. KUB from today shows no ureteral or renal calculi. She has not other compalitns today   PMH: Past Medical History:  Diagnosis Date   Arthritis    Family history of adverse reaction to anesthesia    mother woke up during surgery   Gastric ulcer with hemorrhage    History of kidney stones    Hypertension    PONV (postoperative nausea and vomiting)     Surgical History: Past Surgical History:  Procedure Laterality Date   ABDOMINAL HYSTERECTOMY     APPENDECTOMY     BACK SURGERY     removal of cyst   BIOPSY  09/11/2021   Procedure: BIOPSY;  Surgeon: Eloise Harman, DO;  [facility]: [facility];  Service: Endoscopy;;   CATARACT EXTRACTION W/PHACO Left 06/22/2018   Procedure: CATARACT EXTRACTION PHACO AND INTRAOCULAR LENS PLACEMENT ([place]);  Surgeon: Tonny Branch, MD;  Location: [facility];  Service: Ophthalmology;  Laterality: Left;  CDE: 6.03   CATARACT EXTRACTION W/PHACO Right 07/20/2018   Procedure: CATARACT EXTRACTION PHACO AND INTRAOCULAR LENS PLACEMENT RIGHT EYE CDE=4.50;  Surgeon: Tonny Branch, MD;  Location: [facility];  Service: Ophthalmology;  Laterality: Right;  right   CHOLECYSTECTOMY     COLONOSCOPY WITH PROPOFOL N/A 09/11/2021   Procedure: COLONOSCOPY WITH PROPOFOL;  Surgeon: Eloise Harman, DO;  [facility]: [facility];  Service: Endoscopy;  Laterality: N/A;  9:45am   ESOPHAGOGASTRODUODENOSCOPY (EGD) WITH PROPOFOL N/A 09/11/2021   Procedure: ESOPHAGOGASTRODUODENOSCOPY (EGD) WITH PROPOFOL;  Surgeon: Eloise Harman, DO;  [facility];  Service: Endoscopy;  Laterality: N/A;   EXTRACORPOREAL SHOCK WAVE  LITHOTRIPSY Right 08/22/2020   Procedure: EXTRACORPOREAL SHOCK WAVE LITHOTRIPSY (ESWL);  Surgeon: Cleon Gustin, MD;  Location: [facility];  Service: Urology;  Laterality: Right;   POLYPECTOMY  09/11/2021   Procedure: POLYPECTOMY;  Surgeon: Eloise Harman, DO;  Location: [facility];  Service: Endoscopy;;    Home Medications:  Allergies as of 10/24/2021       Reactions   Penicillins Anaphylaxis   Has patient had a PCN reaction causing immediate rash, facial/tongue/throat swelling, SOB or lightheadedness with hypotension: yes Has patient had a PCN reaction causing severe rash involving mucus membranes or skin necrosis: no Has patient had a PCN reaction that required hospitalization: no Has patient had a PCN reaction occurring within the last 10 years: no If all of the above answers are "NO", then may proceed with Cephalosporin use.   Latex Hives, Swelling        Medication List        Accurate as of October 24, 2021  4:20 PM. If you have any questions, ask your nurse or doctor.          amLODipine 10 MG tablet Commonly known as: NORVASC Take 10 mg by mouth at bedtime.   diphenhydrAMINE 25 MG tablet Commonly known as: BENADRYL Take 25 mg by mouth every 6 (six) hours as needed for sleep.   fexofenadine-pseudoephedrine 60-120 MG 12 hr tablet Commonly known as: ALLEGRA-D Take 1 tablet by mouth daily.   fluticasone 50 MCG/ACT nasal spray Commonly known as: FLONASE Place 1 spray into both nostrils daily  as needed for allergies.   HYDROcodone-acetaminophen 10-325 MG tablet Commonly known as: NORCO Take 1 tablet by mouth every 4 (four) hours as needed for moderate pain.   Melatonin 10 MG Subl Take 10 mg by mouth at bedtime as needed (sleep).   metaxalone 800 MG tablet Commonly known as: SKELAXIN Take 800 mg by mouth 4 (four) times daily as needed for muscle spasms.   pantoprazole 40 MG tablet Commonly known as: Protonix Take 1 tablet (40 mg total) by mouth 2  (two) times daily before a meal.   polyethylene glycol-electrolytes 420 g solution Commonly known as: TriLyte Take 4,000 mLs by mouth as directed.   sodium chloride 0.65 % Soln nasal spray Commonly known as: OCEAN Place 1 spray into both nostrils daily.   tamsulosin 0.4 MG Caps capsule Commonly known as: Flomax Take 1 capsule (0.4 mg total) by mouth daily after supper.   valACYclovir 1000 MG tablet Commonly known as: VALTREX Take 1,000 mg by mouth 3 (three) times daily as needed (fever blister).   valACYclovir 500 MG tablet Commonly known as: VALTREX Take by mouth.        Allergies:  Allergies  Allergen Reactions   Penicillins Anaphylaxis    Has patient had a PCN reaction causing immediate rash, facial/tongue/throat swelling, SOB or lightheadedness with hypotension: yes Has patient had a PCN reaction causing severe rash involving mucus membranes or skin necrosis: no Has patient had a PCN reaction that required hospitalization: no Has patient had a PCN reaction occurring within the last 10 years: no If all of the above answers are "NO", then may proceed with Cephalosporin use.    Latex Hives and Swelling    Family History: Family History  Problem Relation Age of Onset   Diabetes Father    Alzheimer's disease Father     Social History:  reports that she quit smoking about 11 years ago. Her smoking use included cigarettes. She has a 20.00 pack-year smoking history. She has never used smokeless tobacco. She reports that she does not currently use alcohol. She reports that she does not use drugs.  ROS: All other review of systems were reviewed and are negative except what is noted above in HPI  Physical Exam: BP (!) 168/82    Pulse (!) 111   Constitutional:  Alert and oriented, No acute distress. HEENT: [place] AT, moist mucus membranes.  Trachea midline, no masses. Cardiovascular: No clubbing, cyanosis, or edema. Respiratory: Normal respiratory effort, no increased work  of breathing. GI: Abdomen is soft, nontender, nondistended, no abdominal masses GU: No CVA tenderness.  Lymph: No cervical or inguinal lymphadenopathy. Skin: No rashes, bruises or suspicious lesions. Neurologic: Grossly intact, no focal deficits, moving all 4 extremities. Psychiatric: Normal mood and affect.  Laboratory Data: Lab Results  Component Value Date   WBC 6.0 06/10/2018   HGB 12.9 06/10/2018   HCT 38.5 06/10/2018   MCV 95.1 06/10/2018   PLT 351 06/10/2018    Lab Results  Component Value Date   CREATININE 0.74 06/10/2018    No results found for: PSA  No results found for: TESTOSTERONE  No results found for: HGBA1C  Urinalysis    Component Value Date/Time   APPEARANCEUR Clear 12/27/2020 1606   GLUCOSEU Negative 12/27/2020 1606   BILIRUBINUR Negative 12/27/2020 1606   PROTEINUR Negative 12/27/2020 1606   UROBILINOGEN 0.2 04/12/2020 1420   NITRITE Negative 12/27/2020 1606   LEUKOCYTESUR Negative 12/27/2020 1606    Lab Results  Component Value Date  LABMICR Comment 12/27/2020   WBCUA 0-5 09/28/2020   LABEPIT 0-10 09/28/2020   MUCUS Present (A) 09/28/2020   BACTERIA Few (A) 09/28/2020    Pertinent Imaging: KUb today: Images reviewed and discussed with the patient Results for orders placed during the hospital encounter of 04/16/21  Abdomen 1 view (KUB)  Narrative CLINICAL DATA:  History of urinary tract stones and lithotripsy.  EXAM: ABDOMEN - 1 VIEW  COMPARISON:  KUB 12/26/2020 and 09/27/2020. CT abdomen and pelvis 04/19/2020.  FINDINGS: Two subtle foci of increased density projecting over the right kidney are likely the nonobstructing stones best seen on the prior CT. Gas and stool limit evaluation. No other evidence of urinary tract stone is identified. Bowel gas pattern is nonobstructive. No bony abnormality.  IMPRESSION: Two subtle radiopaque densities projecting over the right kidney are likely the nonobstructing stones seen on the  prior CT. Negative for ureteral stone or acute abnormality.   Electronically Signed By: Inge Rise M.D. On: 04/18/2021 09:11  No results found for this or any previous visit.  No results found for this or any previous visit.  No results found for this or any previous visit.  No results found for this or any previous visit.  No results found for this or any previous visit.  No results found for this or any previous visit.  Results for orders placed during the hospital encounter of 04/19/20  CT RENAL STONE STUDY  Narrative CLINICAL DATA:  Chronic right flank pain. Nephrolithiasis. Recurrent urinary tract infections.  EXAM: CT ABDOMEN AND PELVIS WITHOUT CONTRAST  TECHNIQUE: Multidetector CT imaging of the abdomen and pelvis was performed following the standard protocol without IV contrast.  COMPARISON:  None.  FINDINGS: Lower chest: No acute findings.  Hepatobiliary: No mass visualized on this unenhanced exam. Moderate diffuse hepatic steatosis is demonstrated. Prior cholecystectomy. No evidence of biliary obstruction.  Pancreas: No mass or inflammatory process visualized on this unenhanced exam.  Spleen:  Within normal limits in size.  Adrenals/Urinary tract: Several tiny less than 5 mm renal calculi are seen bilaterally. Mild right hydronephrosis is seen, with a 13 mm calculus in the right renal pelvis. No evidence ureteral calculi or dilatation. A few small fluid attenuation cysts are noted in both kidneys. Unremarkable unopacified urinary bladder.  Stomach/Bowel: Moderate to large hiatal hernia is seen. No evidence of obstruction, inflammatory process, or abnormal fluid collections. Diverticulosis is seen mainly involving the sigmoid colon, however there is no evidence of diverticulitis.  Vascular/Lymphatic: No pathologically enlarged lymph nodes identified. No evidence of abdominal aortic aneurysm.  Reproductive: Prior hysterectomy noted. Adnexal  regions are unremarkable in appearance.  Other:  None.  Musculoskeletal:  No suspicious bone lesions identified.  IMPRESSION: Mild right hydronephrosis, with 13 mm calculus in right renal pelvis.  Bilateral nephrolithiasis.  Moderate to large hiatal hernia.  Moderate hepatic steatosis.  Colonic diverticulosis. No radiographic evidence of diverticulitis.   Electronically Signed By: Marlaine Hind M.D. On: 04/19/2020 17:08   Assessment & Plan:    1. Nephrolithiasis -RTC 6 months with KUB  2. Frequent UTI -resolved   No follow-ups on file.  Nicolette Bang, MD  [facility] [place]

## 2022-01-15 ENCOUNTER — Ambulatory Visit: Payer: BC Managed Care – PPO | Admitting: Gastroenterology

## 2022-04-24 ENCOUNTER — Ambulatory Visit: Payer: BC Managed Care – PPO | Admitting: Urology

## 2022-05-08 ENCOUNTER — Ambulatory Visit: Payer: BC Managed Care – PPO | Admitting: Urology

## 2022-05-31 ENCOUNTER — Other Ambulatory Visit: Payer: Self-pay | Admitting: Internal Medicine

## 2022-07-29 IMAGING — DX DG ABDOMEN 1V
1 series · 1 of 1 positions shown · non-contrast
Comparison: 09/06/2020

CLINICAL DATA: Renal stone

EXAM:
ABDOMEN - 1 VIEW

[abdomen kub]
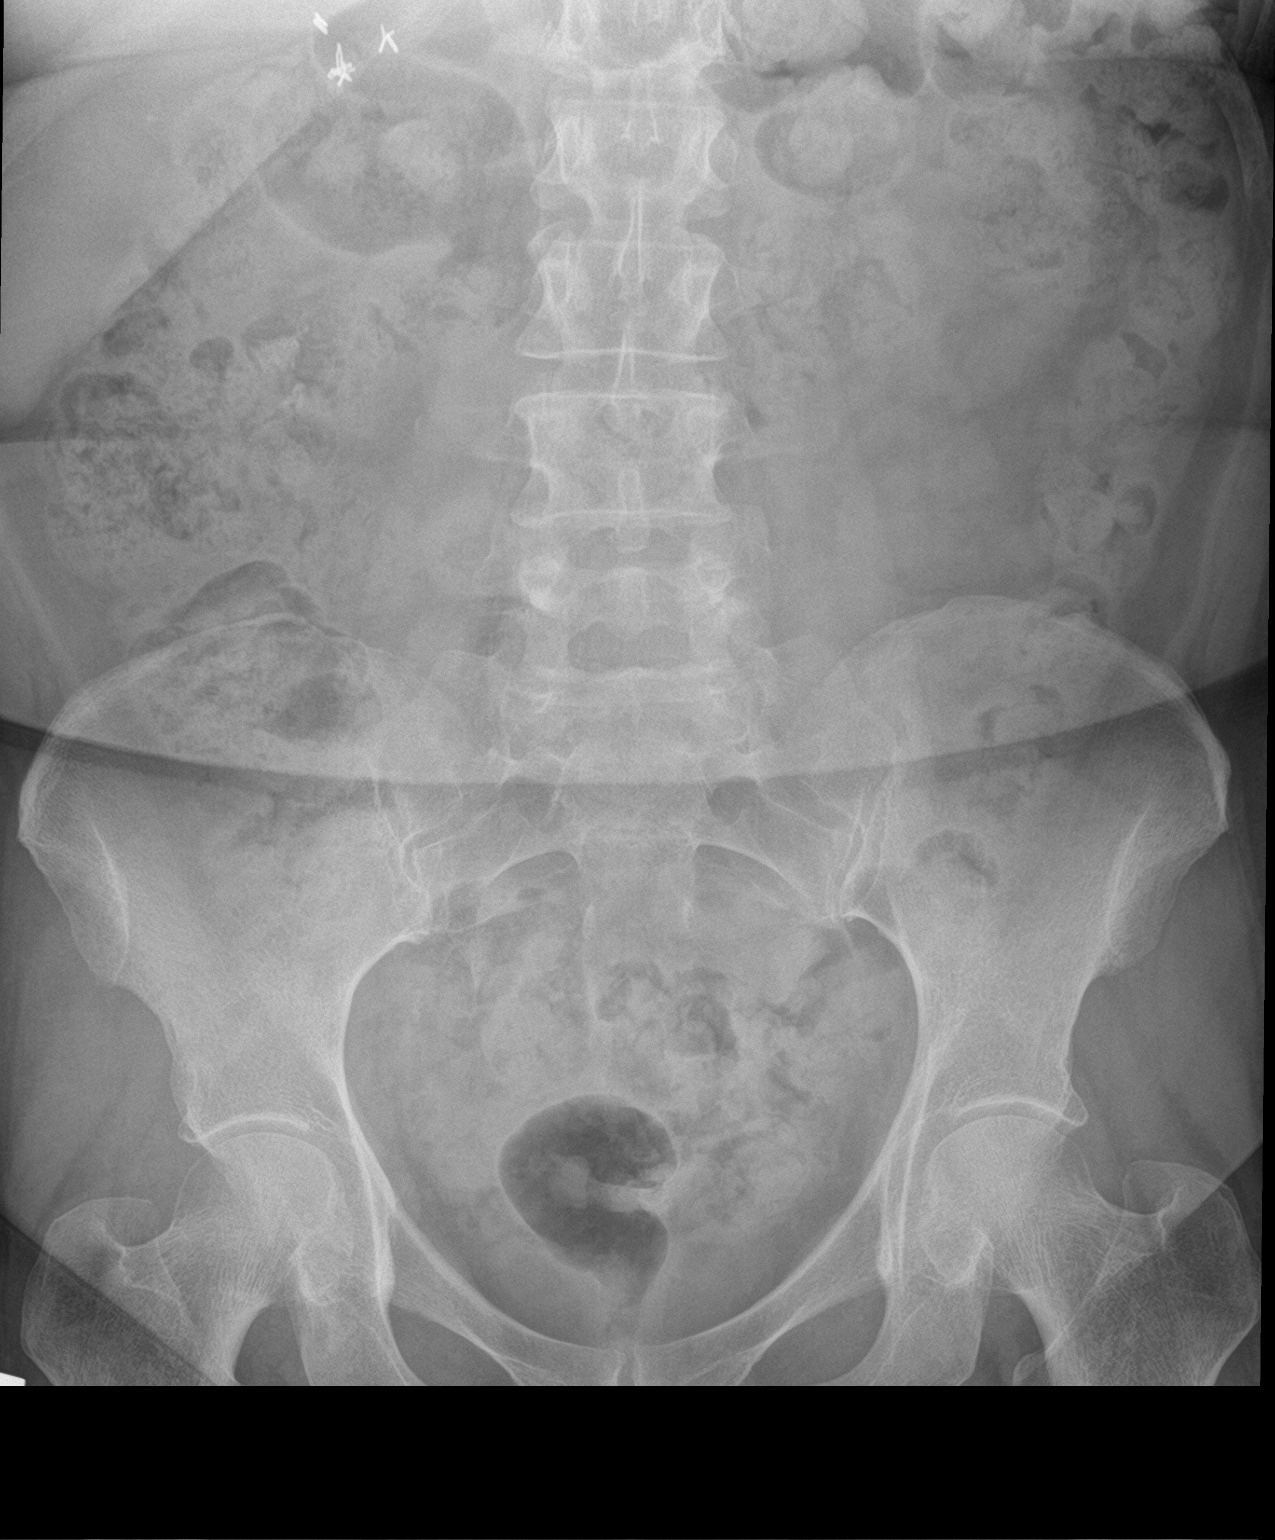

[1 of 1 positions shown; findings below may reference images not displayed]

FINDINGS: Nonobstructive pattern of bowel gas, with a generally large burden
of stool throughout the colon and rectum, which limits assessment
for renal calculi. Within this limitation, no obvious change in a
single calculus projecting over the expected vicinity of the
inferior pole of the right kidney, measuring approximately 7 mm in
projection.
IMPRESSION: Nonobstructive pattern of bowel gas, with a generally large burden
of stool throughout the colon and rectum, which limits assessment
for renal calculi. Within this limitation, no obvious change in a
single calculus projecting over the expected vicinity of the
inferior pole of the right kidney, measuring approximately 7 mm in
projection. CT is the test of choice for the evaluation of urinary
tract calculi.

## 2023-02-15 IMAGING — DX DG ABDOMEN 1V
2 series · 2 of 2 positions shown · non-contrast
Comparison: KUB 12/26/2020 and 09/27/2020. CT abdomen and pelvis
04/19/2020.

CLINICAL DATA: History of urinary tract stones and lithotripsy.

EXAM:
ABDOMEN - 1 VIEW

[abdomen kub (1 of 2)]
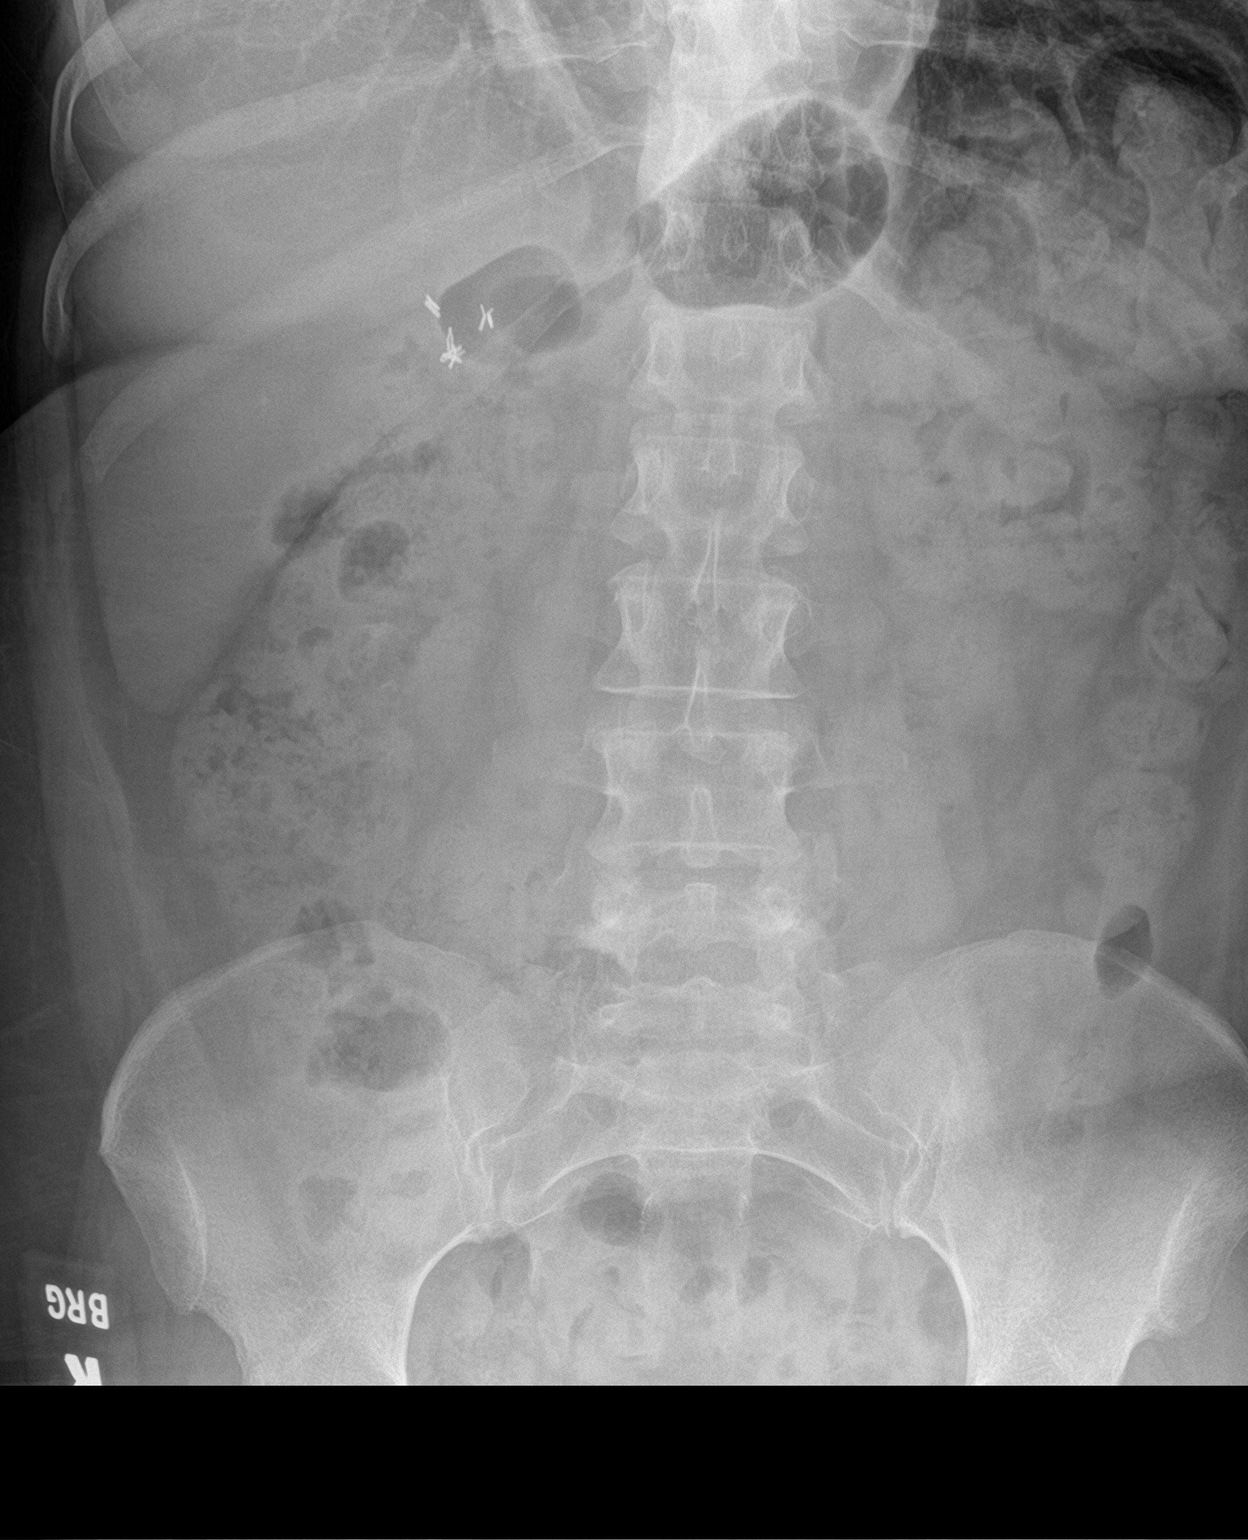

[abdomen kub (2 of 2)]
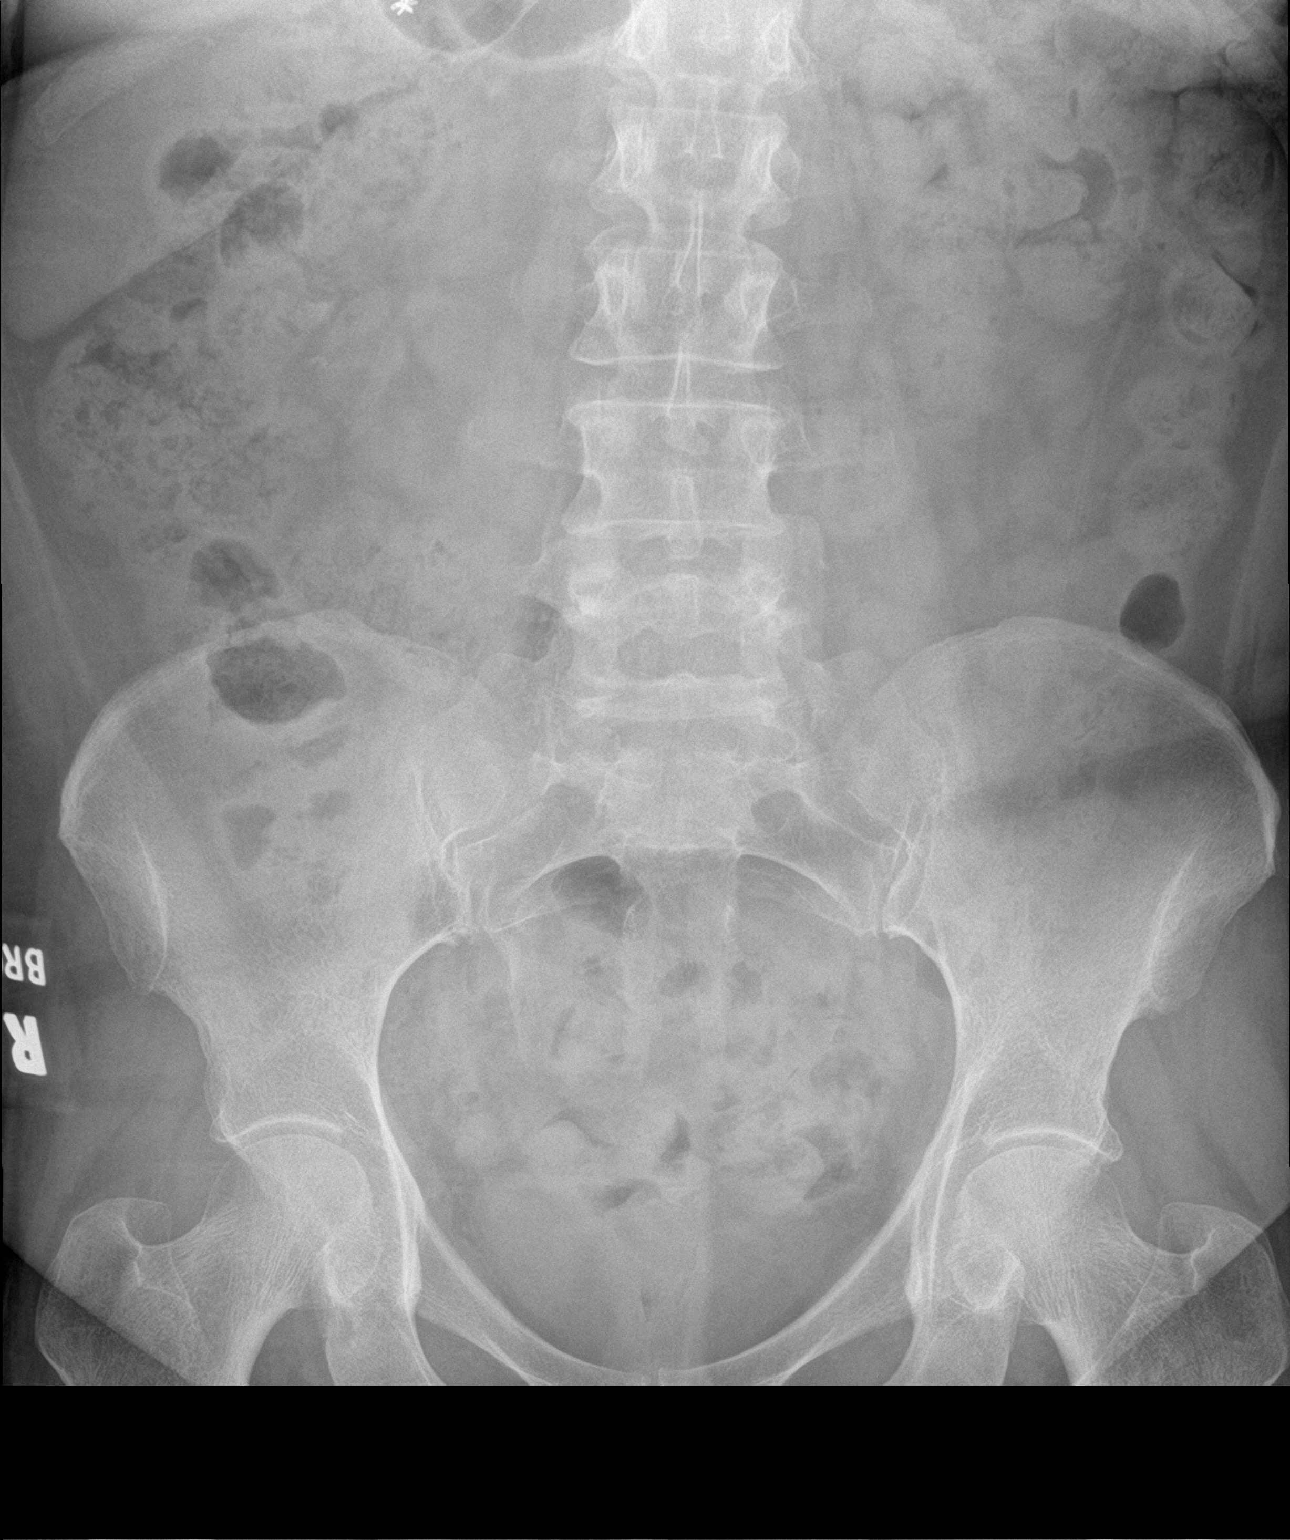

[2 of 2 positions shown; findings below may reference images not displayed]

FINDINGS: Two subtle foci of increased density projecting over the right
kidney are likely the nonobstructing stones best seen on the prior
CT. Gas and stool limit evaluation. No other evidence of urinary
tract stone is identified. Bowel gas pattern is nonobstructive. No
bony abnormality.
IMPRESSION: Two subtle radiopaque densities projecting over the right kidney are
likely the nonobstructing stones seen on the prior CT. Negative for
ureteral stone or acute abnormality.

## 2023-06-30 ENCOUNTER — Other Ambulatory Visit: Payer: Self-pay | Admitting: Internal Medicine
